# Patient Record
Sex: Female | Born: 1980 | Race: White | Hispanic: No | Marital: Married | Smoking: Never smoker
Health system: Southern US, Community
[De-identification: ages and names within clinical notes are randomized; demographics above are authoritative.]

## PROBLEM LIST (undated history)

## (undated) DIAGNOSIS — N839 Noninflammatory disorder of ovary, fallopian tube and broad ligament, unspecified: Secondary | ICD-10-CM

## (undated) DIAGNOSIS — F909 Attention-deficit hyperactivity disorder, unspecified type: Secondary | ICD-10-CM

## (undated) DIAGNOSIS — G4730 Sleep apnea, unspecified: Secondary | ICD-10-CM

## (undated) DIAGNOSIS — T8859XA Other complications of anesthesia, initial encounter: Secondary | ICD-10-CM

## (undated) DIAGNOSIS — M1990 Unspecified osteoarthritis, unspecified site: Secondary | ICD-10-CM

## (undated) DIAGNOSIS — J302 Other seasonal allergic rhinitis: Secondary | ICD-10-CM

## (undated) DIAGNOSIS — M722 Plantar fascial fibromatosis: Secondary | ICD-10-CM

## (undated) DIAGNOSIS — T4145XA Adverse effect of unspecified anesthetic, initial encounter: Secondary | ICD-10-CM

## (undated) DIAGNOSIS — T7840XA Allergy, unspecified, initial encounter: Secondary | ICD-10-CM

## (undated) DIAGNOSIS — E282 Polycystic ovarian syndrome: Secondary | ICD-10-CM

## (undated) HISTORY — DX: Attention-deficit hyperactivity disorder, unspecified type: F90.9

## (undated) HISTORY — DX: Unspecified osteoarthritis, unspecified site: M19.90

## (undated) HISTORY — DX: Plantar fascial fibromatosis: M72.2

## (undated) HISTORY — DX: Noninflammatory disorder of ovary, fallopian tube and broad ligament, unspecified: N83.9

## (undated) HISTORY — PX: MOUTH SURGERY: SHX715

## (undated) HISTORY — PX: TONSILLECTOMY: SUR1361

## (undated) HISTORY — DX: Allergy, unspecified, initial encounter: T78.40XA

## (undated) HISTORY — PX: FOOT SURGERY: SHX648

## (undated) SURGERY — LAPAROSCOPIC CHOLECYSTECTOMY
Anesthesia: General

---

## 2005-05-01 ENCOUNTER — Inpatient Hospital Stay (HOSPITAL_COMMUNITY): Admission: AD | Admit: 2005-05-01 | Discharge: 2005-05-03 | Payer: Self-pay | Admitting: Obstetrics and Gynecology

## 2006-06-15 ENCOUNTER — Emergency Department: Payer: Self-pay | Admitting: Internal Medicine

## 2011-04-03 ENCOUNTER — Ambulatory Visit: Payer: Self-pay | Admitting: General Practice

## 2012-11-14 ENCOUNTER — Emergency Department: Payer: Self-pay | Admitting: Emergency Medicine

## 2013-03-21 ENCOUNTER — Emergency Department: Payer: Self-pay | Admitting: Emergency Medicine

## 2013-03-21 LAB — COMPREHENSIVE METABOLIC PANEL
Albumin: 3.4 g/dL (ref 3.4–5.0)
Anion Gap: 5 — ABNORMAL LOW (ref 7–16)
Bilirubin,Total: 0.2 mg/dL (ref 0.2–1.0)
Calcium, Total: 9 mg/dL (ref 8.5–10.1)
EGFR (Non-African Amer.): >60
Glucose: 183 mg/dL — ABNORMAL HIGH (ref 65–99)
Osmolality: 280 (ref 275–301)
Sodium: 138 mmol/L (ref 136–145)

## 2013-03-21 LAB — LIPASE, BLOOD: Lipase: 84 U/L (ref 73–393)

## 2013-03-21 LAB — CBC
HCT: 42 % (ref 35.0–47.0)
HGB: 14 g/dL (ref 12.0–16.0)
MCHC: 33.2 g/dL (ref 32.0–36.0)

## 2013-06-16 ENCOUNTER — Ambulatory Visit: Payer: Self-pay | Admitting: Physician Assistant

## 2015-05-24 ENCOUNTER — Other Ambulatory Visit: Payer: Self-pay | Admitting: Certified Nurse Midwife

## 2015-05-24 DIAGNOSIS — N63 Unspecified lump in unspecified breast: Secondary | ICD-10-CM

## 2015-05-25 ENCOUNTER — Ambulatory Visit
Admission: RE | Admit: 2015-05-25 | Discharge: 2015-05-25 | Disposition: A | Discharge status: Home / Self Care | Payer: PRIVATE HEALTH INSURANCE | Source: Ambulatory Visit | Attending: Certified Nurse Midwife | Admitting: Certified Nurse Midwife

## 2015-05-25 ENCOUNTER — Other Ambulatory Visit: Payer: Self-pay | Admitting: Certified Nurse Midwife

## 2015-05-25 DIAGNOSIS — N63 Unspecified lump in unspecified breast: Secondary | ICD-10-CM

## 2015-06-28 ENCOUNTER — Encounter: Payer: Self-pay | Admitting: Family

## 2015-06-28 ENCOUNTER — Ambulatory Visit: Payer: Self-pay | Admitting: Family

## 2015-06-28 VITALS — BP 110/80 | HR 87 | Temp 98.3°F

## 2015-06-28 DIAGNOSIS — M25562 Pain in left knee: Secondary | ICD-10-CM

## 2015-06-28 DIAGNOSIS — Z8742 Personal history of other diseases of the female genital tract: Secondary | ICD-10-CM

## 2015-06-28 DIAGNOSIS — Z6841 Body Mass Index (BMI) 40.0 and over, adult: Secondary | ICD-10-CM

## 2015-06-28 MED ORDER — DICLOFENAC SODIUM 1 % TD GEL: 2.0000 g | Freq: Four times a day (QID) | TRANSDERMAL | Status: DC

## 2015-06-28 NOTE — Progress Notes (Signed)
S/ one mo h/o Left knee pain, no remote or recent injury. Morbidly obese.  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 She denies instability or locking up other joint involvement or fever Taking ibuprofen otc tid . O/ pleasant alert NAD   Morbidly obese Left knee without deformity, edema or redness ; minimal tenderness medially , no instability noted Gait normal  Squats without difficulty   A/ left knee pain P / advised limiting squatting at work . Continue motrin . RX for voltaren gel 2 g qid  topical heat or ice. Weight loss strongly encouraged.

## 2015-07-05 ENCOUNTER — Inpatient Hospital Stay: Payer: PRIVATE HEALTH INSURANCE | Attending: Obstetrics and Gynecology | Admitting: Obstetrics and Gynecology

## 2015-07-05 ENCOUNTER — Encounter: Payer: Self-pay | Admitting: Obstetrics and Gynecology

## 2015-07-05 VITALS — BP 141/92 | HR 66 | Temp 98.2°F | Ht 61.0 in | Wt 315.0 lb

## 2015-07-05 DIAGNOSIS — E282 Polycystic ovarian syndrome: Secondary | ICD-10-CM

## 2015-07-05 DIAGNOSIS — G4730 Sleep apnea, unspecified: Secondary | ICD-10-CM

## 2015-07-05 DIAGNOSIS — N83201 Unspecified ovarian cyst, right side: Secondary | ICD-10-CM | POA: Diagnosis not present

## 2015-07-05 DIAGNOSIS — N839 Noninflammatory disorder of ovary, fallopian tube and broad ligament, unspecified: Secondary | ICD-10-CM

## 2015-07-05 DIAGNOSIS — Z6841 Body Mass Index (BMI) 40.0 and over, adult: Secondary | ICD-10-CM | POA: Diagnosis not present

## 2015-07-05 DIAGNOSIS — N838 Other noninflammatory disorders of ovary, fallopian tube and broad ligament: Secondary | ICD-10-CM

## 2015-07-05 DIAGNOSIS — Z791 Long term (current) use of non-steroidal anti-inflammatories (NSAID): Secondary | ICD-10-CM

## 2015-07-05 HISTORY — DX: Noninflammatory disorder of ovary, fallopian tube and broad ligament, unspecified: N83.9

## 2015-07-05 NOTE — Progress Notes (Signed)
Gynecologic Oncology Consult Visit   Referring Provider:  Dalia Heading CNM, [facility]  Chief Concern: right ovarian cystic mass  Subjective:  Barbara Horn is a 34 y.o. G2P2 female who is seen in consultation from Dr. [name] for right ovarian mass.  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  BMI 62.  Refused medical management suggested by Dr Baxter Hire of [facility], such as low dose OCs and metformin, to decrease unopposed estrogen.    LMP 1/15, but had some spotting and endometrial bx 06/14/15 showed disordered proliferative endometrium.  PAP and HPV negative 9/16.  US done 9/16 to monitor for endometrial cancer showed normal uterus with 10 mm endometrial stripe and 7.8 x 7 x 5.5 cm multicystic right ovarian mass.   CA125 06/23/15 = 15.3  She has sleep apnea and fatigue.  Works in [employer].   Problem List: Patient Active Problem List   Diagnosis Date Noted  . Ovarian mass, right 07/05/2015  . History of PCOS 06/28/2015  . Morbid obesity ([place]) 06/28/2015    Past Medical History: Past Medical History  Diagnosis Date  . Breast mass     Left Lump 9 o'clock per physician    Past Surgical History: No past surgical history on file.  Family History: No family history on file.   No family history of breast or ovarian cancer.  All women in her family had hysterectomy by age 84 for various reasons.   Social History: Social History   Social History  . Marital Status: Married    Spouse Name: N/A  . Number of Children: N/A  . Years of Education: N/A   Occupational History  . Not on file.   Social History Main Topics  . Smoking status: Never Smoker   . Smokeless tobacco: Not on file  . Alcohol Use: No  . Drug Use: Not on file  . Sexual Activity: Not on file   Other Topics Concern  . Not on file   Social History Narrative    Allergies: Allergies  Allergen Reactions  . Penicillins     Current Medications: Current Outpatient Prescriptions   Medication Sig Dispense Refill  . diclofenac sodium (VOLTAREN) 1 % GEL Apply 2 g topically 4 (four) times daily. 1 Tube 2  . ibuprofen (ADVIL,MOTRIN) 800 MG tablet Take 800 mg by mouth every 8 (eight) hours as needed.     No current facility-administered medications for this visit.    Review of Systems General: negative for, fevers, chills, fatigue, changes in sleep, changes in weight or appetite Skin: negative for changes in color, texture, moles or lesions Eyes: negative for, changes in vision, pain, diplopia HEENT: negative for, change in hearing, pain, discharge, tinnitus, vertigo, voice changes, sore throat, neck masses Breasts: negative Pulmonary: negative for, dyspnea, orthopnea, productive cough Cardiac: negative for, palpitations, syncope, pain, discomfort, pressure Gastrointestinal: negative for, dysphagia, nausea, vomiting, jaundice, pain, constipation, diarrhea, hematemesis, hematochezia Genitourinary/Sexual: negative for, dysuria, discharge, hesitancy, nocturia, retention, stones, infections, STD's, incontinence Ob/Gyn: negative for, irregular bleeding, pain Musculoskeletal: negative for, pain, stiffness, swelling, range of motion limitation Hematology: No anemia, negative for, easy bruising, bleeding Neurologic/Psych: negative for, headaches, seizures, paralysis, weakness, tremor, change in gait, change in sensation, mood swings, depression, anxiety, change in memory  Objective:  Physical Examination:  BP 141/92 mmHg  Pulse 66  Temp(Src) 98.2 F (36.8 C) (Oral)  Wt 315 lb 0.6 oz (142.9 kg)   ECOG Performance Status: 0 - Asymptomatic  General appearance: alert, cooperative and appears stated  age HEENT:PERRLA, neck supple with midline trachea and thyroid without masses Lymph node survey: non-palpable, axillary, inguinal, supraclavicular Cardiovascular: regular rate and rhythm, no murmurs or gallops Respiratory: normal air entry, lungs clear to auscultation Breast  exam: not examined. Abdomen: soft, non-tender, without masses or organomegaly, no hernias and well healed incision Back: inspection of back is normal Extremities: extremities normal, atraumatic, no cyanosis or edema Skin exam - normal coloration and turgor, no rashes, no suspicious skin lesions noted. Neurological exam reveals alert, oriented, normal speech, no focal findings or movement disorder noted.  Pelvic: exam chaperoned by nurse;  Vulva: normal appearing vulva with no masses, tenderness or lesions; Vagina: normal vagina; Adnexa: nontender and no masses; Uterus: uterus is normal size, shape, consistency and nontender; Cervix: no lesions; Rectal: normal rectal, no masses    Assessment:  Barbara Horn is a 34 y.o. female with Polycystic Ovary Syndrome who was found to have 7 cm right ovarian multicystic mass on ultrasound. Referred for consideration for surgery due to her morbid obesity.  She is asymptomatic.  Risk of ovarian cancer is low due to her young age, [place] characteristics of the mass and normal CA125.  Multicystic ovary may be related to large functional cysts related to PCOS, but cysts not usually that large in this syndrome.    Plan:   Problem List Items Addressed This Visit      Other   Ovarian mass, right - Primary      We discussed options for management including most notably seeing an endocrinologist at [facility] clinic to start medical management of PCOS with metformin and possibly OCs.  She has been offered this before and refused,but seems ready to comply now.  In view of low risk for ovarian cancer and lack of symptoms, would not recommend surgery immediately to remove the mass.  Would repeat US in 6 weeks at [facility] for surveillance.  If ovarian cysts decrease with endocrine therapy, or remain the same, surgical intervention is not mandatory.  Surgery would be risky because of her morbid obesity and it is unlikely that the ovarian lesion is life a threatening cancer.  We  can see her back to consider surgery if the mass grows further or is causing symptoms.        Mellody Drown, MD  CC:  Dalia Heading, [address] [place], [place] [address] [phone]

## 2015-07-05 NOTE — Progress Notes (Signed)
Assisted MD with pelvic exam

## 2015-08-11 ENCOUNTER — Encounter: Payer: Self-pay | Admitting: Physician Assistant

## 2015-08-11 ENCOUNTER — Ambulatory Visit: Payer: Self-pay | Admitting: Physician Assistant

## 2015-08-11 VITALS — BP 120/70 | HR 81 | Temp 98.5°F

## 2015-08-11 DIAGNOSIS — H10023 Other mucopurulent conjunctivitis, bilateral: Secondary | ICD-10-CM

## 2015-08-11 MED ORDER — CIPROFLOXACIN HCL 0.3 % OP SOLN: 1.0000 [drp] | OPHTHALMIC | Status: DC

## 2015-08-11 NOTE — Addendum Note (Signed)
Addended by: Colleen Can A on: 08/11/2015 10:58 AM   Modules accepted: Orders

## 2015-08-11 NOTE — Progress Notes (Signed)
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 Handwashing .

## 2015-08-11 NOTE — Addendum Note (Signed)
Addended by: Colleen Can A on: 08/11/2015 10:14 AM   Modules accepted: Orders

## 2015-09-20 DIAGNOSIS — N83202 Unspecified ovarian cyst, left side: Secondary | ICD-10-CM

## 2015-09-20 DIAGNOSIS — N83201 Unspecified ovarian cyst, right side: Secondary | ICD-10-CM | POA: Insufficient documentation

## 2015-09-28 ENCOUNTER — Encounter: Payer: Self-pay | Admitting: Physician Assistant

## 2015-09-28 ENCOUNTER — Ambulatory Visit: Payer: Self-pay | Admitting: Physician Assistant

## 2015-09-28 VITALS — BP 119/70 | HR 73 | Temp 98.0°F

## 2015-09-28 DIAGNOSIS — J069 Acute upper respiratory infection, unspecified: Secondary | ICD-10-CM

## 2015-09-28 MED ORDER — AZITHROMYCIN 250 MG PO TABS: ORAL_TABLET | ORAL | Status: DC

## 2015-09-28 MED ORDER — METHYLPREDNISOLONE 4 MG PO TBPK: ORAL_TABLET | ORAL | Status: DC

## 2015-09-28 NOTE — Progress Notes (Signed)
S: C/o runny nose and congestion for 5 days, no fever, chills, cp/sob, v/d; mucus was green this am but clear throughout the day, cough is sporadic, is wheezing some but used her husbands inhaler  O: PE: vitals wnl, nad, perrl eomi, normocephalic, tms dull, nasal mucosa red and swollen, throat injected, neck supple no lymph, lungs c t a, cv rrr, neuro intact  A:  Acute uri   P: zpack, medrol dose pack, drink fluids, continue regular meds , use otc meds of choice, return if not improving in 5 days, return earlier if worsening

## 2015-10-23 ENCOUNTER — Encounter: Payer: Self-pay | Admitting: Physician Assistant

## 2015-10-23 ENCOUNTER — Ambulatory Visit: Payer: Self-pay | Admitting: Physician Assistant

## 2015-10-23 VITALS — BP 130/80 | HR 91 | Temp 97.8°F

## 2015-10-23 DIAGNOSIS — J018 Other acute sinusitis: Secondary | ICD-10-CM

## 2015-10-23 MED ORDER — CEFDINIR 300 MG PO CAPS: 300.0000 mg | ORAL_CAPSULE | Freq: Two times a day (BID) | ORAL | Status: DC

## 2015-10-23 MED ORDER — FLUCONAZOLE 150 MG PO TABS: 150.0000 mg | ORAL_TABLET | Freq: Once | ORAL | Status: DC

## 2015-10-23 MED ORDER — PREDNISONE 10 MG PO TABS: 30.0000 mg | ORAL_TABLET | Freq: Every day | ORAL | Status: DC

## 2015-10-23 NOTE — Progress Notes (Signed)
S: C/o runny nose and congestion for 3 days, no fever, some chills, denies  cp/sob, v/d; mucus is green and thick in the morning, c/o of facial and ear pain. Had similar sx few weeks ago, treated with zpack  Using otc meds:   O: PE: vitals wnl, nad,  perrl eomi, normocephalic, tms dull, nasal mucosa red and swollen, throat injected, neck supple no lymph, lungs c t a, cv rrr, neuro intact  A:  Acute sinusitis   Pomnicef 300mg  bid, prednisone 30mg  qd x 3d, diflucan: drink fluids, continue regular meds , use otc meds of choice, return if not improving in 5 days, return earlier if worsening

## 2015-10-30 ENCOUNTER — Ambulatory Visit: Payer: Self-pay | Admitting: Physician Assistant

## 2015-10-30 ENCOUNTER — Encounter: Payer: Self-pay | Admitting: Physician Assistant

## 2015-10-30 VITALS — BP 110/70 | HR 69 | Temp 97.8°F

## 2015-10-30 DIAGNOSIS — R1030 Lower abdominal pain, unspecified: Secondary | ICD-10-CM

## 2015-10-30 LAB — POCT URINALYSIS DIPSTICK
Bilirubin, UA: NEGATIVE
GLUCOSE UA: NEGATIVE
Leukocytes, UA: NEGATIVE
NITRITE UA: NEGATIVE
PROTEIN UA: NEGATIVE
Spec Grav, UA: 1.015
UROBILINOGEN UA: 0.2
pH, UA: 6

## 2015-10-30 NOTE — Progress Notes (Signed)
S: pt has hx of abdominal/pelvic masses, surgeon will not take them out until she loses weight, started having some blood in urine, no flank pain, no fever/chills, just wanted to check and see if its a uti  O: vitals wnl, nad, ua 1+ blood, no cva tenderness  A: abdominal pain  P: pt to f/u with gyn at [facility] , to call today to discuss pain and bleeding

## 2015-10-30 NOTE — Addendum Note (Signed)
Addended by: Versie Starks on: 10/30/2015 08:43 AM   Modules accepted: Orders

## 2015-11-17 ENCOUNTER — Ambulatory Visit: Payer: Self-pay | Admitting: Physician Assistant

## 2015-11-17 ENCOUNTER — Encounter: Payer: Self-pay | Admitting: Physician Assistant

## 2015-11-17 VITALS — BP 130/70 | HR 76 | Temp 98.3°F

## 2015-11-17 DIAGNOSIS — L0291 Cutaneous abscess, unspecified: Secondary | ICD-10-CM

## 2015-11-17 MED ORDER — SULFAMETHOXAZOLE-TRIMETHOPRIM 800-160 MG PO TABS
1.0000 | ORAL_TABLET | Freq: Two times a day (BID) | ORAL | Status: DC
Start: 2015-11-17 — End: 2015-11-24

## 2015-11-17 NOTE — Progress Notes (Signed)
S: red swollen area on left side of jaw line, had a pimple there, then scratched the area, now its hard and swollen, no fever/chills/dif breathing  O: vitals wnl, nad, skin with red swollen area on near chin, no drainage, no fluctuance, area of hardness extends into neck, air way is intact, n/v intact  A: abscess  P: septra ds 1 po bid, warm compress, if area is getting larger and is pushing toward middle of throat pt should go to ER asap; return if not better by [facility]

## 2015-11-24 ENCOUNTER — Encounter: Payer: Self-pay | Admitting: Physician Assistant

## 2015-11-24 ENCOUNTER — Ambulatory Visit: Payer: Self-pay | Admitting: Physician Assistant

## 2015-11-24 VITALS — BP 130/80 | HR 89 | Temp 98.3°F

## 2015-11-24 DIAGNOSIS — B349 Viral infection, unspecified: Secondary | ICD-10-CM

## 2015-11-24 LAB — POCT INFLUENZA A/B
INFLUENZA A, POC: NEGATIVE
INFLUENZA B, POC: NEGATIVE

## 2015-11-24 MED ORDER — FEXOFENADINE-PSEUDOEPHED ER 60-120 MG PO TB12: 1.0000 | ORAL_TABLET | Freq: Two times a day (BID) | ORAL | Status: DC

## 2015-11-24 NOTE — Addendum Note (Signed)
Addended by: Rudene Anda T on: 11/24/2015 01:49 PM   Modules accepted: Orders

## 2015-11-24 NOTE — Progress Notes (Signed)
   Subjective:    Patient ID: Barbara Horn, female    DOB: 12-Sep-1981, 35 y.o.   MRN: [id number]  HPI Patient c/o malaise, sinus congestion and ear pressure. Recent treated for adenopathy and took last dose of Bactrim DS this morning. Denies N/V/D. No Flu shot for this season.   Review of Systems Obesity, Mandibular and Auricular Adenopathy.     Objective:   Physical Exam Appears malaise, Resolving Adenopathy from last visit. Bilateral edematous nasal turbinates with clear Rhinorrhea. Post nasal drainage. Neck supple, Lungs CTA, and Heart RRR. Rapid Flu Test is negative.      Assessment & Plan:  Viral illness with nasal congestion.  Allergra-D. Work note.  Follow up 3 days if no improvement.

## 2015-12-01 ENCOUNTER — Ambulatory Visit: Payer: Self-pay | Admitting: Physician Assistant

## 2015-12-01 ENCOUNTER — Encounter: Payer: Self-pay | Admitting: Physician Assistant

## 2015-12-01 VITALS — BP 138/76 | HR 78 | Temp 98.4°F

## 2015-12-01 DIAGNOSIS — H6982 Other specified disorders of Eustachian tube, left ear: Secondary | ICD-10-CM

## 2015-12-01 MED ORDER — FLUTICASONE PROPIONATE 50 MCG/ACT NA SUSP: 2.0000 | Freq: Every day | NASAL | Status: DC

## 2015-12-01 NOTE — Progress Notes (Signed)
S: c/o of left ear feeling full and having pressure, some sinus drainage, no fever/chills/cough, also still has lump where abscess was, it finally opened and drained , finished antibiotics, still taking allegra d for her ear  O: vitals wnl, nad, tms dull, nasal mucosa wnl, throat wnl, neck supple no lymph, lungs c t a, cv rrr, skin with healed abscess, area is still a little hard which is residual  A: healed abscess, eustachean tube dysfunction  P: flonase and sudafed, stop allegra d; (was expensive for pt); warm compress to area of abscess

## 2015-12-18 ENCOUNTER — Other Ambulatory Visit: Payer: Self-pay | Admitting: Emergency Medicine

## 2015-12-18 DIAGNOSIS — B349 Viral infection, unspecified: Secondary | ICD-10-CM

## 2015-12-18 MED ORDER — FEXOFENADINE-PSEUDOEPHED ER 60-120 MG PO TB12: 1.0000 | ORAL_TABLET | Freq: Two times a day (BID) | ORAL | Status: DC

## 2015-12-18 NOTE — Telephone Encounter (Signed)
Received a faxed medication request from [facility] in [place].  Please advise.  Thank you. 

## 2015-12-18 NOTE — Telephone Encounter (Signed)
Med refill approved 

## 2015-12-21 ENCOUNTER — Ambulatory Visit: Payer: Managed Care, Other (non HMO) | Attending: Gynecologic Oncology | Admitting: Gynecologic Oncology

## 2015-12-21 ENCOUNTER — Encounter: Payer: Self-pay | Admitting: Gynecologic Oncology

## 2015-12-21 VITALS — BP 147/80 | HR 68 | Temp 98.3°F | Resp 18 | Ht 61.0 in | Wt 289.2 lb

## 2015-12-21 DIAGNOSIS — N939 Abnormal uterine and vaginal bleeding, unspecified: Secondary | ICD-10-CM | POA: Insufficient documentation

## 2015-12-21 DIAGNOSIS — N83202 Unspecified ovarian cyst, left side: Secondary | ICD-10-CM | POA: Insufficient documentation

## 2015-12-21 DIAGNOSIS — N83201 Unspecified ovarian cyst, right side: Secondary | ICD-10-CM | POA: Insufficient documentation

## 2015-12-21 DIAGNOSIS — R634 Abnormal weight loss: Secondary | ICD-10-CM | POA: Diagnosis not present

## 2015-12-21 DIAGNOSIS — D3501 Benign neoplasm of right adrenal gland: Secondary | ICD-10-CM | POA: Diagnosis not present

## 2015-12-21 DIAGNOSIS — Z8742 Personal history of other diseases of the female genital tract: Secondary | ICD-10-CM | POA: Diagnosis not present

## 2015-12-21 DIAGNOSIS — L03211 Cellulitis of face: Secondary | ICD-10-CM | POA: Diagnosis not present

## 2015-12-21 DIAGNOSIS — K130 Diseases of lips: Secondary | ICD-10-CM | POA: Insufficient documentation

## 2015-12-21 DIAGNOSIS — E282 Polycystic ovarian syndrome: Secondary | ICD-10-CM

## 2015-12-21 DIAGNOSIS — Z6841 Body Mass Index (BMI) 40.0 and over, adult: Secondary | ICD-10-CM | POA: Diagnosis not present

## 2015-12-21 DIAGNOSIS — E119 Type 2 diabetes mellitus without complications: Secondary | ICD-10-CM | POA: Insufficient documentation

## 2015-12-21 DIAGNOSIS — R5382 Chronic fatigue, unspecified: Secondary | ICD-10-CM | POA: Insufficient documentation

## 2015-12-21 DIAGNOSIS — N7011 Chronic salpingitis: Secondary | ICD-10-CM | POA: Diagnosis not present

## 2015-12-21 MED ORDER — CLINDAMYCIN HCL 300 MG PO CAPS: 300.0000 mg | ORAL_CAPSULE | Freq: Three times a day (TID) | ORAL | Status: DC

## 2015-12-21 NOTE — Progress Notes (Signed)
GYNECOLOGIC ONCOLOGY OFFICE VISIT  Referring Provider: Dr. Teresa Coombs  ASSESSMENT AND PLAN: Barbara Horn is a 35 y.o. woman with bilateral hydrosalpinges, MRI obtained in March 2017's notable for bilateral fluid-filled fallopian tubes. The adnexa appeared normal. Advised to follow-up with [name] Follow-up with GYN oncology when necessary pelvic discomfort  [name] Counseled on the importance of regular uterine bleeding to decrease the risk of endometrial cancer. Continue OCPs as prescribed  Abnormal uterine bleeding Barbara Horn has been conscientious with a menstrual calendar and it identifies that the menses are becoming shorter.  morbid obesity.  Obesity  Barbara Horn has sought assistance from her endocrinologist and her primary care doctors regarding medically supervised weight loss measures but has not received much support. She will continue in her endeavors to lose weight and reports a 20 pound weight loss for the past few months with her current diet and exercise regimen.  Left sided lip cellulitis Erythema and swelling were recognized this morning. She reports mild tenderness denies fever and chills Clindamycin prescribed Advised to follow up in the emergency room if there is increase in the swelling or tenderness or intensity of the erythema.  HISTORY OF PRESENT ILLNESS: Barbara Horn is a 35 y.o. woman who is seen in consultation at the request of Dr. Kenton Kingfisher for evaluation of bilateral ovarian cyst c/b morbid obesity.  She has a past medical history significant for morbid obesity with a BMI of 62, PCOS, amenorrhea, and diabetes with an A1c of 6.1. She presented to her GYN with amenorrhea at least 91mo, endometrial biopsy was performed that showed proliferative endometrium. Pap smear was normal. On 06/14/15 a transvaginal ultrasound showed a normal uterus, 8 x 5 x 4cm, ES 5.108mm.  A 7.8 x 7.1 x 5.6 septated right ovarian cyst was noted. CA-125 was  normal. She was referred to [facility]) who recommended follow-up ultrasound. She was started on OCPs for ovulatory suppression and metformin. On 09/12/15, transvaginal ultrasound showed 7.8 x 8.9 x 8.5 cm right ovarian cyst with avasular septations (slightly larger), and a new left ovarian cyst 4 x 4.1 x 3.2 cm with avascular septations. She is asymptomatic and declined to go back to [facility] for further follow-up.  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 Normal appearance of the adjacent ovaries. --Prominent junctional zone is seen in the uterus. This may be an indirect sign of adenomyosis.  - Right adrenal adenoma.   Past Medical History  Diagnosis Date  . Obesity   . Bilateral ovarian cysts   . Chronic fatigue   . PCOS (polycystic ovarian syndrome)      PAST SURGICAL HISTORY: Past Surgical History  Procedure Laterality Date  . Myomectomy      x2  . Tonsilectomy, adenoidectomy, bilateral myringotomy and tubes      PHYSICAL EXAM: BP 147/80 mmHg  Pulse 68  Temp(Src) 98.3 F (36.8 C) (Oral)  Resp 18  Ht 5\' 1"  (1.549 m)  Wt 289 lb 3.2 oz (131.18 kg)  BMI 54.67 kg/m2  SpO2 100%  HEENT.  Mild erythema surounding the left lateral aspect of the lip with edema extension to the lower cheek.  No lymphangitic streaking, no fluctuance, tender to light touch General: Alert, oriented,  no acute distress. Chest: Clear to auscultation bilaterally. Cardiovascular: Regular rate and rhythm, no murmurs, rubs, or gallops. Abdomen: Morbidly Obese. Soft, nondistended, nontender to palpation.  No masses or hepatosplenomegaly appreciated.   Extremities: Grossly normal range of motion.  Warm, well perfused.  No edema  bilaterally. Skin: No rashes or lesions. Lymphatics: No cervical, supraclavicular, or inguinal adenopathy. GU: deferred

## 2015-12-21 NOTE — Patient Instructions (Addendum)
PCOS  Continue  OCP and menstrual calendar   Lip cellulitis Antibiotics  as prescribed Present to ER if swelling is worse in 24 hours  Follow up in one year or sooner if needed.  Clindamycin capsules What is this medicine? CLINDAMYCIN (Snyder sin) is a lincosamide antibiotic. It is used to treat certain kinds of bacterial infections. It will not work for colds, flu, or other viral infections. This medicine may be used for other purposes; ask your health care provider or pharmacist if you have questions. What should I tell my health care provider before I take this medicine? They need to know if you have any of these conditions: -kidney disease -liver disease -stomach problems like colitis -an unusual or allergic reaction to clindamycin, lincomycin, or other medicines, foods, dyes like tartrazine or preservatives -pregnant or trying to get pregnant -breast-feeding How should I use this medicine? Take this medicine by mouth with a full glass of water. Follow the directions on the prescription label. You can take this medicine with food or on an empty stomach. If the medicine upsets your stomach, take it with food. Take your medicine at regular intervals. Do not take your medicine more often than directed. Take all of your medicine as directed even if you think your are better. Do not skip doses or stop your medicine early. Talk to your pediatrician regarding the use of this medicine in children. Special care may be needed. Overdosage: If you think you have taken too much of this medicine contact a poison control center or emergency room at once. NOTE: This medicine is only for you. Do not share this medicine with others. What if I miss a dose? If you miss a dose, take it as soon as you can. If it is almost time for your next dose, take only that dose. Do not take double or extra doses. What may interact with this medicine? -birth control  pills -chloramphenicol -erythromycin -kaolin products This list may not describe all possible interactions. Give your health care provider a list of all the medicines, herbs, non-prescription drugs, or dietary supplements you use. Also tell them if you smoke, drink alcohol, or use illegal drugs. Some items may interact with your medicine. What should I watch for while using this medicine? Tell your doctor or healthcare professional if your symptoms do not start to get better or if they get worse. Do not treat diarrhea with over the counter products. Contact your doctor if you have diarrhea that lasts more than 2 days or if it is severe and watery. What side effects may I notice from receiving this medicine? Side effects that you should report to your doctor or health care professional as soon as possible: -allergic reactions like skin rash, itching or hives, swelling of the face, lips, or tongue -dark urine -pain on swallowing -redness, blistering, peeling or loosening of the skin, including inside the mouth -unusual bleeding or bruising -unusually weak or tired -yellowing of eyes or skin Side effects that usually do not require medical attention (report to your doctor or health care professional if they continue or are bothersome): -diarrhea -itching in the rectal or genital area -joint pain -nausea, vomiting -stomach pain This list may not describe all possible side effects. Call your doctor for medical advice about side effects. You may report side effects to FDA at 1-800-FDA-1088. Where should I keep my medicine? Keep out of the reach of children. Store at room temperature between 20 and 25 degrees C (  68 and 77 degrees F). Throw away any unused medicine after the expiration date. NOTE: This sheet is a summary. It may not cover all possible information. If you have questions about this medicine, talk to your doctor, pharmacist, or health care provider.    2016, [employer]  Standard. (2013-04-15 16:12:32)

## 2015-12-22 ENCOUNTER — Emergency Department
Admission: EM | Admit: 2015-12-22 | Discharge: 2015-12-23 | Disposition: A | Discharge status: Home / Self Care | Payer: Managed Care, Other (non HMO) | Attending: Emergency Medicine | Admitting: Emergency Medicine

## 2015-12-22 ENCOUNTER — Encounter: Payer: Self-pay | Admitting: Emergency Medicine

## 2015-12-22 DIAGNOSIS — K130 Diseases of lips: Secondary | ICD-10-CM | POA: Diagnosis not present

## 2015-12-22 DIAGNOSIS — L0201 Cutaneous abscess of face: Secondary | ICD-10-CM

## 2015-12-22 LAB — BASIC METABOLIC PANEL
Anion gap: 9 (ref 5–15)
BUN: 13 mg/dL (ref 6–20)
CALCIUM: 9.1 mg/dL (ref 8.9–10.3)
CHLORIDE: 106 mmol/L (ref 101–111)
CO2: 21 mmol/L — ABNORMAL LOW (ref 22–32)
CREATININE: 0.68 mg/dL (ref 0.44–1.00)
Glucose, Bld: 67 mg/dL (ref 65–99)
Potassium: 3.8 mmol/L (ref 3.5–5.1)
SODIUM: 136 mmol/L (ref 135–145)

## 2015-12-22 LAB — CBC WITH DIFFERENTIAL/PLATELET
BASOS PCT: 0 %
Basophils Absolute: 0.1 10*3/uL (ref 0–0.1)
EOS ABS: 0.4 10*3/uL (ref 0–0.7)
EOS PCT: 4 %
HCT: 40.9 % (ref 35.0–47.0)
Hemoglobin: 13.1 g/dL (ref 12.0–16.0)
LYMPHS ABS: 3 10*3/uL (ref 1.0–3.6)
Lymphocytes Relative: 24 %
MCH: 27.5 pg (ref 26.0–34.0)
MCHC: 32.2 g/dL (ref 32.0–36.0)
MCV: 85.4 fL (ref 80.0–100.0)
MONO ABS: 1 10*3/uL — AB (ref 0.2–0.9)
MONOS PCT: 8 %
Neutro Abs: 7.8 10*3/uL — ABNORMAL HIGH (ref 1.4–6.5)
Neutrophils Relative %: 64 %
PLATELETS: 241 10*3/uL (ref 150–440)
RBC: 4.79 MIL/uL (ref 3.80–5.20)
RDW: 14.8 % — AB (ref 11.5–14.5)
WBC: 12.2 10*3/uL — ABNORMAL HIGH (ref 3.6–11.0)

## 2015-12-22 MED ORDER — LIDOCAINE 5 % EX OINT
TOPICAL_OINTMENT | Freq: Once | CUTANEOUS | Status: AC
  Administered 2015-12-22: 1 via TOPICAL
  Filled 2015-12-22: qty 35.44

## 2015-12-22 MED ORDER — LIDOCAINE 4 % EX CREA
TOPICAL_CREAM | Freq: Once | CUTANEOUS | Status: DC
  Filled 2015-12-22: qty 5

## 2015-12-22 MED ORDER — LIDOCAINE-EPINEPHRINE-TETRACAINE (LET) SOLUTION: 3.0000 mL | Freq: Once | NASAL | Status: DC

## 2015-12-22 MED ORDER — MUPIROCIN CALCIUM 2 % EX CREA
TOPICAL_CREAM | Freq: Once | CUTANEOUS | Status: AC
  Administered 2015-12-23: 1 via TOPICAL
  Filled 2015-12-22: qty 15

## 2015-12-22 MED ORDER — OXYCODONE-ACETAMINOPHEN 5-325 MG PO TABS
1.0000 | ORAL_TABLET | ORAL | Status: DC | PRN
  Administered 2015-12-22: 1 via ORAL
  Filled 2015-12-22: qty 1

## 2015-12-22 NOTE — ED Notes (Signed)
Pt started out with red bump to left upper lip area.  Started clindamycin and reports has had 5 doses but swelling has gotten worse despite starting abx.  Noted left lip and facial swelling.  Denies fever but has been taking motrin around clock.

## 2015-12-22 NOTE — ED Provider Notes (Signed)
[facility] Emergency Department Provider Note  ____________________________________________  Time seen: Approximately 9:14 PM  I have reviewed the triage vital signs and the nursing notes.   HISTORY  Chief Complaint Facial Swelling    HPI Barbara Horn is a 35 y.o. 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  It started about a week ago as a small red bump that did not really even look like a pimple.  Over several days ago worse and was very swollen and tender with a lot of redness essentially over the entire left side of her upper lip.  She saw her hematologist oncologist (she does not have cancer and was just released from their care, this was a last visit) and was started on clindamycin.  She has had 5 doses and although the swelling does not involve her entire lip anymore (it had spread to the other side), the pain is getting worse in the area of redness on the leftside.  She has opened the wound once and a small amount of pus drained out, but it is now covered again with a scab with a surrounding white "head".   She denies fever/chills, N/V/D, CP, SOB, abd pain.  Pain is severe, worsening by palpation, and worse (along with the swelling) after using her CPAP.  Nothing makes it better.     Past Medical History  Diagnosis Date  . Breast mass     Left Lump 9 o'clock per physician    Patient Active Problem List   Diagnosis Date Noted  . Ovarian mass, right 07/05/2015  . History of PCOS 06/28/2015  . Morbid obesity ([place]) 06/28/2015    History reviewed. No pertinent past surgical history.  Current Outpatient Rx  Name  Route  Sig  Dispense  Refill  . clindamycin (CLEOCIN) 300 MG capsule   Oral   Take 1 capsule (300 mg total) by mouth 3 (three) times daily.   21 capsule   0   . docusate sodium (COLACE) 100 MG capsule       Take 1 tablet once or twice daily as needed for constipation while taking narcotic pain medicine   30 capsule   0   . doxycycline (VIBRAMYCIN) 100 MG capsule      Take 1 capsule (100 mg) by mouth twice daily for 10 days.   20 capsule   0   . fluticasone (FLONASE) 50 MCG/ACT nasal spray   Each Nare   Place 2 sprays into both nostrils daily.   16 g   6   . HYDROcodone-acetaminophen (NORCO/VICODIN) 5-325 MG tablet   Oral   Take 1-2 tablets by mouth every 4 (four) hours as needed for moderate pain.   15 tablet   0   . ibuprofen (ADVIL,MOTRIN) 800 MG tablet   Oral   Take 800 mg by mouth every 8 (eight) hours as needed.         Marland Kitchen levonorgestrel-ethinyl estradiol (ALTAVERA) 0.15-30 MG-MCG tablet      TAKE ONE TABLET BY MOUTH ONCE DAILY FOR 6 WEEKS, THEN STOP FOR 1 WEEK, THEN REPEAT CYCLE         . metFORMIN (GLUCOPHAGE) 1000 MG tablet   Oral   Take 1,000 mg by mouth daily with breakfast.            Allergies Bee venom and Penicillins  History reviewed. No pertinent family history.  Social History Social History  Substance Use Topics  . Smoking status: Never Smoker   . Smokeless tobacco: None  . Alcohol Use: No    Review of Systems Constitutional: No fever/chills Eyes: No visual changes. ENT: No sore throat. Cardiovascular: Denies chest pain. Respiratory: Denies shortness of breath. Gastrointestinal: No abdominal pain.  No nausea, no vomiting.  No diarrhea.  No constipation. Genitourinary: Negative for dysuria. Musculoskeletal: Negative for back pain. Skin: Abscess and swelling to left upper lip Neurological: Negative for headaches, focal weakness or numbness.  10-point ROS otherwise negative.  ____________________________________________   PHYSICAL EXAM:  VITAL SIGNS: ED Triage Vitals  Enc Vitals Group     BP 12/22/15 1817 154/100 mmHg     Pulse Rate 12/22/15 1817 79     Resp 12/22/15 1817 18     Temp 12/22/15 1817 99.3 F (37.4 C)     Temp  Source 12/22/15 1817 Oral     SpO2 12/22/15 1817 100 %     Weight 12/22/15 1817 285 lb (129.275 kg)     Height 12/22/15 1817 5' (1.524 m)     Head Cir --      Peak Flow --      Pain Score 12/22/15 1815 9     Pain Loc --      Pain Edu? --      Excl. in [place]? --     Constitutional: Alert and oriented. Well appearing but tearful from pain Eyes: Conjunctivae are normal. PERRL. EOMI. Head: Atraumatic. Nose: No congestion/rhinnorhea. Mouth/Throat: Approx 3-cm diameter area of erythema and induration with central white "head" of underlying purulence, consistent with abscess.  Palpable from inside the lip, but does not pass through.  No radiating cellulitis.  Tender and warm. Neck: No stridor.  No meningeal signs.   Cardiovascular: Normal rate, regular rhythm. Good peripheral circulation. Grossly normal heart sounds.   Respiratory: Normal respiratory effort.  No retractions. Lungs CTAB. Gastrointestinal: Soft and nontender. No distention.  Musculoskeletal: No lower extremity tenderness nor edema. No gross deformities of extremities. Neurologic:  Normal speech and language. No gross focal neurologic deficits are appreciated.  Psychiatric: Mood and affect are normal. Speech and behavior are normal.  ____________________________________________   LABS (all labs ordered are listed, but only abnormal results are displayed)  Labs Reviewed  CBC WITH DIFFERENTIAL/PLATELET - Abnormal; Notable for the following:    WBC 12.2 (*)    RDW 14.8 (*)    Neutro Abs 7.8 (*)    Monocytes Absolute 1.0 (*)    All other components within normal limits  BASIC METABOLIC PANEL - Abnormal; Notable for the following:    CO2 21 (*)    All other components within normal limits  WOUND CULTURE  ANAEROBIC CULTURE   ____________________________________________  EKG  None ____________________________________________  RADIOLOGY   No results  found.  ____________________________________________   PROCEDURES  Procedure(s) performed: I&D, see procedure note(s).  INCISION AND DRAINAGE Performed by: Hinda Kehr Consent: Verbal consent obtained. Risks and benefits: risks, benefits and alternatives were discussed Type: abscess  Body area: left upper lip  Abscess was "de-roofed" with scalpel #11 and gently probed with 18-gauge needle  Complexity: complex Blunt dissection to break up loculations  Drainage: purulent  Drainage amount: 1-2 mL  Aerobic and anaerobic culture obtained.  After drainage and pressing gently from inside lip to express purulent material, I cleansed the wound with iodine (as recommended by Dr. Kathyrn Sheriff) and applied Bactroban directly  to the wound.  Patient tolerance: Patient tolerated the procedure well with no immediate complications.     Critical Care performed: No ____________________________________________   INITIAL IMPRESSION / ASSESSMENT AND PLAN / ED COURSE  Pertinent labs & imaging results that were available during my care of the patient were reviewed by me and considered in my medical decision making (see chart for details).  Spoke by phone with Dr. Kathyrn Sheriff to discuss appropriate management.  He recommended taking the roof off the abscess and allowing purulent material to drain, but not to incise as I would with a cutaneous abscess on other parts of the body.  He suggested draining what would come out, cleansing with iodine, and applying Bactroban inside the wound.  Advised patient to remove scab at least daily if not twice a day and reapply Bactroban (gave her the tube).  She will follow up in ENT clinic on Monday.  Gave strict return precautions.  Also started patient on doxycycline in addition to current clindamycin, which Dr. Kathyrn Sheriff also recommended.  ____________________________________________  FINAL CLINICAL IMPRESSION(S) / ED DIAGNOSES  Final diagnoses:  Facial abscess       NEW MEDICATIONS STARTED DURING THIS VISIT:  Discharge Medication List as of 12/23/2015 12:10 AM    START taking these medications   Details  docusate sodium (COLACE) 100 MG capsule Take 1 tablet once or twice daily as needed for constipation while taking narcotic pain medicine, Print    doxycycline (VIBRAMYCIN) 100 MG capsule Take 1 capsule (100 mg) by mouth twice daily for 10 days., Print    HYDROcodone-acetaminophen (NORCO/VICODIN) 5-325 MG tablet Take 1-2 tablets by mouth every 4 (four) hours as needed for moderate pain., Starting 12/23/2015, Until Discontinued, Print          Note:  This document was prepared using Dragon voice recognition software and may include unintentional dictation errors.   Hinda Kehr, MD 12/23/15 [phone]

## 2015-12-23 MED ORDER — HYDROCODONE-ACETAMINOPHEN 5-325 MG PO TABS: 1.0000 | ORAL_TABLET | ORAL | Status: DC | PRN

## 2015-12-23 MED ORDER — DOCUSATE SODIUM 100 MG PO CAPS: ORAL_CAPSULE | ORAL | Status: DC

## 2015-12-23 MED ORDER — OXYCODONE-ACETAMINOPHEN 5-325 MG PO TABS
2.0000 | ORAL_TABLET | Freq: Once | ORAL | Status: AC
Start: 2015-12-23 — End: 2015-12-23
  Administered 2015-12-23: 2 via ORAL
  Filled 2015-12-23: qty 2

## 2015-12-23 MED ORDER — DOXYCYCLINE HYCLATE 100 MG PO TABS
100.0000 mg | ORAL_TABLET | Freq: Once | ORAL | Status: AC
  Administered 2015-12-23: 100 mg via ORAL
  Filled 2015-12-23: qty 1

## 2015-12-23 MED ORDER — DOXYCYCLINE HYCLATE 100 MG PO CAPS: ORAL_CAPSULE | ORAL | Status: DC

## 2015-12-23 NOTE — Discharge Instructions (Signed)
As we discussed, you need to remove the scab from your abscess twice daily and re-apply the Bactroban ointment.  When you shower you can thoroughly wash and carefully and gently dry the wound before reapplying the ointment.  Take both the previously prescribed antibiotics (clindamycin) and the new antibiotics I prescribed (doxycycline).  Follow up with Dr. Kathyrn Sheriff on Monday.    Take Norco as prescribed. Do not drink alcohol, drive or participate in any other potentially dangerous activities while taking this medication as it may make you sleepy. Do not take this medication with any other sedating medications, either prescription or over-the-counter.  If your wound is getting worse, or if you develop new symptoms that concern you, please return immediately to the Emergency Department.   Abscess An abscess is an infected area that contains a collection of pus and debris.It can occur in almost any part of the body. An abscess is also known as a furuncle or boil. CAUSES  An abscess occurs when tissue gets infected. This can occur from blockage of oil or sweat glands, infection of hair follicles, or a minor injury to the skin. As the body tries to fight the infection, pus collects in the area and creates pressure under the skin. This pressure causes pain. People with weakened immune systems have difficulty fighting infections and get certain abscesses more often.  SYMPTOMS Usually an abscess develops on the skin and becomes a painful mass that is red, warm, and tender. If the abscess forms under the skin, you may feel a moveable soft area under the skin. Some abscesses break open (rupture) on their own, but most will continue to get worse without care. The infection can spread deeper into the body and eventually into the bloodstream, causing you to feel ill.  DIAGNOSIS  Your caregiver will take your medical history and perform a physical exam. A sample of fluid may also be taken from the abscess to  determine what is causing your infection. TREATMENT  Your caregiver may prescribe antibiotic medicines to fight the infection. However, taking antibiotics alone usually does not cure an abscess. Your caregiver may need to make a small cut (incision) in the abscess to drain the pus. In some cases, gauze is packed into the abscess to reduce pain and to continue draining the area. HOME CARE INSTRUCTIONS   Only take over-the-counter or prescription medicines for pain, discomfort, or fever as directed by your caregiver.  If you were prescribed antibiotics, take them as directed. Finish them even if you start to feel better.  If gauze is used, follow your caregiver's directions for changing the gauze.  To avoid spreading the infection:  Keep your draining abscess covered with a bandage.  Wash your hands well.  Do not share personal care items, towels, or whirlpools with others.  Avoid skin contact with others.  Keep your skin and clothes clean around the abscess.  Keep all follow-up appointments as directed by your caregiver. SEEK MEDICAL CARE IF:   You have increased pain, swelling, redness, fluid drainage, or bleeding.  You have muscle aches, chills, or a general ill feeling.  You have a fever. MAKE SURE YOU:   Understand these instructions.  Will watch your condition.  Will get help right away if you are not doing well or get worse.   This information is not intended to replace advice given to you by your health care provider. Make sure you discuss any questions you have with your health care provider.   Document  Released: 06/19/2005 Document Revised: 03/10/2012 Document Reviewed: 11/22/2011 Elsevier Interactive Patient Education 2016 Else[employer] Inc[employer]  Incision and Drainage Incision and drainage is a procedure in which a sac-like structure (cystic structure) is opened and drained. The area to be drained usually contains material such as pus, fluid, or blood.  LET YOUR  CAREGIVER KNOW ABOUT:   Allergies to medicine.  Medicines taken, including vitamins, herbs, eyedrops, over-the-counter medicines, and creams.  Use of steroids (by mouth or creams).  Previous problems with anesthetics or numbing medicines.  History of bleeding problems or blood clots.  Previous surgery.  Other health problems, including diabetes and kidney problems.  Possibility of pregnancy, if this applies. RISKS AND COMPLICATIONS  Pain.  Bleeding.  Scarring.  Infection. BEFORE THE PROCEDURE  You may need to have an ultrasound or other imaging tests to see how large or deep your cystic structure is. Blood tests may also be used to determine if you have an infection or how severe the infection is. You may need to have a tetanus shot. PROCEDURE  The affected area is cleaned with a cleaning fluid. The cyst area will then be numbed with a medicine (local anesthetic). A small incision will be made in the cystic structure. A syringe or catheter may be used to drain the contents of the cystic structure, or the contents may be squeezed out. The area will then be flushed with a cleansing solution. After cleansing the area, it is often gently packed with a gauze or another wound dressing. Once it is packed, it will be covered with gauze and tape or some other type of wound dressing. AFTER THE PROCEDURE   Often, you will be allowed to go home right after the procedure.  You may be given antibiotic medicine to prevent or heal an infection.  If the area was packed with gauze or some other wound dressing, you will likely need to come back in 1 to 2 days to get it removed.  The area should heal in about 14 days.   This information is not intended to replace advice given to you by your health care provider. Make sure you discuss any questions you have with your health care provider.   Document Released: 03/05/2001 Document Revised: 03/10/2012 Document Reviewed: 11/04/2011 Elsevier  Interactive Patient Education [employer].

## 2015-12-25 LAB — WOUND CULTURE: SPECIAL REQUESTS: NORMAL

## 2015-12-27 LAB — ANAEROBIC CULTURE: Special Requests: NORMAL

## 2016-01-15 ENCOUNTER — Emergency Department: Payer: Managed Care, Other (non HMO)

## 2016-01-15 ENCOUNTER — Other Ambulatory Visit: Payer: Self-pay

## 2016-01-15 ENCOUNTER — Encounter: Payer: Self-pay | Admitting: *Deleted

## 2016-01-15 ENCOUNTER — Emergency Department
Admission: EM | Admit: 2016-01-15 | Discharge: 2016-01-15 | Disposition: A | Discharge status: Home / Self Care | Payer: Managed Care, Other (non HMO) | Attending: Emergency Medicine | Admitting: Emergency Medicine

## 2016-01-15 DIAGNOSIS — K2970 Gastritis, unspecified, without bleeding: Secondary | ICD-10-CM

## 2016-01-15 DIAGNOSIS — K8020 Calculus of gallbladder without cholecystitis without obstruction: Secondary | ICD-10-CM | POA: Diagnosis not present

## 2016-01-15 DIAGNOSIS — R109 Unspecified abdominal pain: Secondary | ICD-10-CM

## 2016-01-15 DIAGNOSIS — Z7984 Long term (current) use of oral hypoglycemic drugs: Secondary | ICD-10-CM | POA: Diagnosis not present

## 2016-01-15 DIAGNOSIS — R1013 Epigastric pain: Secondary | ICD-10-CM | POA: Diagnosis present

## 2016-01-15 DIAGNOSIS — Z79899 Other long term (current) drug therapy: Secondary | ICD-10-CM | POA: Insufficient documentation

## 2016-01-15 HISTORY — DX: Polycystic ovarian syndrome: E28.2

## 2016-01-15 LAB — URINALYSIS COMPLETE WITH MICROSCOPIC (ARMC ONLY)
Bacteria, UA: NONE SEEN
Bilirubin Urine: NEGATIVE
GLUCOSE, UA: NEGATIVE mg/dL
Hgb urine dipstick: NEGATIVE
Ketones, ur: NEGATIVE mg/dL
Leukocytes, UA: NEGATIVE
Nitrite: NEGATIVE
PROTEIN: NEGATIVE mg/dL
RBC / HPF: NONE SEEN RBC/hpf (ref 0–5)
SPECIFIC GRAVITY, URINE: 1.013 (ref 1.005–1.030)
pH: 8 (ref 5.0–8.0)

## 2016-01-15 LAB — TROPONIN I

## 2016-01-15 LAB — CBC
HCT: 36.6 % (ref 35.0–47.0)
HEMOGLOBIN: 12.7 g/dL (ref 12.0–16.0)
MCH: 28.3 pg (ref 26.0–34.0)
MCHC: 34.6 g/dL (ref 32.0–36.0)
MCV: 81.8 fL (ref 80.0–100.0)
PLATELETS: 236 10*3/uL (ref 150–440)
RBC: 4.48 MIL/uL (ref 3.80–5.20)
RDW: 13.8 % (ref 11.5–14.5)
WBC: 11.4 10*3/uL — ABNORMAL HIGH (ref 3.6–11.0)

## 2016-01-15 LAB — COMPREHENSIVE METABOLIC PANEL
ALBUMIN: 3.7 g/dL (ref 3.5–5.0)
ALK PHOS: 43 U/L (ref 38–126)
ALT: 18 U/L (ref 14–54)
AST: 22 U/L (ref 15–41)
Anion gap: 11 (ref 5–15)
BUN: 11 mg/dL (ref 6–20)
CALCIUM: 9.4 mg/dL (ref 8.9–10.3)
CHLORIDE: 104 mmol/L (ref 101–111)
CO2: 22 mmol/L (ref 22–32)
CREATININE: 0.68 mg/dL (ref 0.44–1.00)
GFR calc Af Amer: >60 mL/min (ref >60)
GFR calc non Af Amer: >60 mL/min (ref >60)
GLUCOSE: 116 mg/dL — AB (ref 65–99)
Potassium: 4.3 mmol/L (ref 3.5–5.1)
SODIUM: 137 mmol/L (ref 135–145)
Total Bilirubin: 0.3 mg/dL (ref 0.3–1.2)
Total Protein: 7.6 g/dL (ref 6.5–8.1)

## 2016-01-15 LAB — POCT PREGNANCY, URINE: Preg Test, Ur: NEGATIVE

## 2016-01-15 LAB — LIPASE, BLOOD: LIPASE: 23 U/L (ref 11–51)

## 2016-01-15 MED ORDER — ONDANSETRON HCL 4 MG/2ML IJ SOLN
4.0000 mg | Freq: Once | INTRAMUSCULAR | Status: AC
  Administered 2016-01-15: 4 mg via INTRAVENOUS
  Filled 2016-01-15: qty 2

## 2016-01-15 MED ORDER — ONDANSETRON 4 MG PO TBDP: 4.0000 mg | ORAL_TABLET | Freq: Three times a day (TID) | ORAL | Status: DC | PRN

## 2016-01-15 MED ORDER — SUCRALFATE 1 G PO TABS: 1.0000 g | ORAL_TABLET | Freq: Two times a day (BID) | ORAL | Status: DC

## 2016-01-15 MED ORDER — MORPHINE SULFATE (PF) 4 MG/ML IV SOLN
4.0000 mg | Freq: Once | INTRAVENOUS | Status: AC
  Administered 2016-01-15: 4 mg via INTRAVENOUS
  Filled 2016-01-15: qty 1

## 2016-01-15 MED ORDER — SODIUM CHLORIDE 0.9 % IV BOLUS (SEPSIS)
1000.0000 mL | Freq: Once | INTRAVENOUS | Status: AC
  Administered 2016-01-15: 1000 mL via INTRAVENOUS

## 2016-01-15 MED ORDER — OXYCODONE-ACETAMINOPHEN 5-325 MG PO TABS: 1.0000 | ORAL_TABLET | Freq: Four times a day (QID) | ORAL | Status: DC | PRN

## 2016-01-15 MED ORDER — GI COCKTAIL ~~LOC~~
30.0000 mL | Freq: Once | ORAL | Status: AC
  Administered 2016-01-15: 30 mL via ORAL

## 2016-01-15 NOTE — Discharge Instructions (Signed)
Abdominal Pain, Adult Many things can cause abdominal pain. Usually, abdominal pain is not caused by a disease and will improve without treatment. It can often be observed and treated at home. Your health care provider will do a physical exam and possibly order blood tests and X-rays to help determine the seriousness of your pain. However, in many cases, more time must pass before a clear cause of the pain can be found. Before that point, your health care provider may not know if you need more testing or further treatment. HOME CARE INSTRUCTIONS Monitor your abdominal pain for any changes. The following actions may help to alleviate any discomfort you are experiencing:  Only take over-the-counter or prescription medicines as directed by your health care provider.  Do not take laxatives unless directed to do so by your health care provider.  Try a clear liquid diet (broth, tea, or water) as directed by your health care provider. Slowly move to a bland diet as tolerated. SEEK MEDICAL CARE IF:  You have unexplained abdominal pain.  You have abdominal pain associated with nausea or diarrhea.  You have pain when you urinate or have a bowel movement.  You experience abdominal pain that wakes you in the night.  You have abdominal pain that is worsened or improved by eating food.  You have abdominal pain that is worsened with eating fatty foods.  You have a fever. SEEK IMMEDIATE MEDICAL CARE IF:  Your pain does not go away within 2 hours.  You keep throwing up (vomiting).  Your pain is felt only in portions of the abdomen, such as the right side or the left lower portion of the abdomen.  You pass bloody or black tarry stools. MAKE SURE YOU:  Understand these instructions.  Will watch your condition.  Will get help right away if you are not doing well or get worse.   This information is not intended to replace advice given to you by your health care provider. Make sure you discuss  any questions you have with your health care provider.   Document Released: 06/19/2005 Document Revised: 05/31/2015 Document Reviewed: 05/19/2013 Elsevier Interactive Patient Education 2016 [place].  Biliary Colic Biliary colic is a pain in the upper abdomen. The pain:  Is usually felt on the right side of the abdomen, but it may also be felt in the center of the abdomen, just below the breastbone (sternum).  May spread back toward the right shoulder blade.  May be steady or irregular.  May be accompanied by nausea and vomiting. Most of the time, the pain goes away in 1-5 hours. After the most intense pain passes, the abdomen may continue to ache mildly for about 24 hours. Biliary colic is caused by a blockage in the bile duct. The bile duct is a pathway that carries bile--a liquid that helps to digest fats--from the gallbladder to the small intestine. Biliary colic usually occurs after eating, when the digestive system demands bile. The pain develops when muscle cells contract forcefully to try to move the blockage so that bile can get by. HOME CARE INSTRUCTIONS  Take medicines only as directed by your health care provider.  Drink enough fluid to keep your urine clear or pale yellow.  Avoid fatty, greasy, and fried foods. These kinds of foods increase your body's demand for bile.  Avoid any foods that make your pain worse.  Avoid overeating.  Avoid having a large meal after fasting. SEEK MEDICAL CARE IF:  You develop a fever.  Your pain gets worse.  You vomit.  You develop nausea that prevents you from eating and drinking. SEEK IMMEDIATE MEDICAL CARE IF:  You suddenly develop a fever and shaking chills.  You develop a yellowish discoloration (jaundice) of:  Skin.  Whites of the eyes.  Mucous membranes.  You have continuous or severe pain that is not relieved with medicines.  You have nausea and vomiting that is not relieved with medicines.  You develop  dizziness or you faint.   This information is not intended to replace advice given to you by your health care provider. Make sure you discuss any questions you have with your health care provider.   Document Released: 02/10/2006 Document Revised: 01/24/2015 Document Reviewed: 06/21/2014 Elsevier Interactive Patient Education 2016 [employer].  Cholelithiasis Cholelithiasis (also called gallstones) is a form of gallbladder disease in which gallstones form in your gallbladder. The gallbladder is an organ that stores bile made in the liver, which helps digest fats. Gallstones begin as small crystals and slowly grow into stones. Gallstone pain occurs when the gallbladder spasms and a gallstone is blocking the duct. Pain can also occur when a stone passes out of the duct.  RISK FACTORS  Being female.   Having multiple pregnancies. Health care providers sometimes advise removing diseased gallbladders before future pregnancies.   Being obese.  Eating a diet heavy in fried foods and fat.   Being older than 78 years and increasing age.   Prolonged use of medicines containing female hormones.   Having diabetes mellitus.   Rapidly losing weight.   Having a family history of gallstones (heredity).  SYMPTOMS  Nausea.   Vomiting.  Abdominal pain.   Yellowing of the skin (jaundice).   Sudden pain. It may persist from several minutes to several hours.  Fever.   Tenderness to the touch. In some cases, when gallstones do not move into the bile duct, people have no pain or symptoms. These are called "silent" gallstones.  TREATMENT Silent gallstones do not need treatment. In severe cases, emergency surgery may be required. Options for treatment include:  Surgery to remove the gallbladder. This is the most common treatment.  Medicines. These do not always work and may take 6-12 months or more to work.  Shock wave treatment (extracorporeal biliary lithotripsy). In this  treatment an ultrasound machine sends shock waves to the gallbladder to break gallstones into smaller pieces that can pass into the intestines or be dissolved by medicine. HOME CARE INSTRUCTIONS   Only take over-the-counter or prescription medicines for pain, discomfort, or fever as directed by your health care provider.   Follow a low-fat diet until seen again by your health care provider. Fat causes the gallbladder to contract, which can result in pain.   Follow up with your health care provider as directed. Attacks are almost always recurrent and surgery is usually required for permanent treatment.  SEEK IMMEDIATE MEDICAL CARE IF:   Your pain increases and is not controlled by medicines.   You have a fever or persistent symptoms for more than 2-3 days.   You have a fever and your symptoms suddenly get worse.   You have persistent nausea and vomiting.  MAKE SURE YOU:   Understand these instructions.  Will watch your condition.  Will get help right away if you are not doing well or get worse.   This information is not intended to replace advice given to you by your health care provider. Make sure you discuss any questions you  have with your health care provider.   Document Released: 09/05/2005 Document Revised: 05/12/2013 Document Reviewed: 03/03/2013 Elsevier Interactive Patient Education [employer].

## 2016-01-15 NOTE — ED Notes (Signed)
Discharge instructions reviewed with patient. Patient verbalized understanding. Patient ambulated to lobby without difficulty.   

## 2016-01-15 NOTE — ED Notes (Signed)
Pt c/o epigastric pain that radiates to chest. Pt denies nausea and vomiting. Pt had similar pain that was not as bad a few days ago.

## 2016-01-15 NOTE — ED Provider Notes (Signed)
[facility] Emergency Department Provider Note  ____________________________________________  Time seen: Approximately 236 AM  I have reviewed the triage vital signs and the nursing notes.   HISTORY  Chief Complaint Abdominal Pain    HPI Barbara Horn is a 35 y.o. female who comes into the hospital today with epigastric pain. She reports that she has this pain radiates to her right. She reports that she started feeling uncomfortable around 8 PM. She reports that she took a Gas-X and then at times and omeprazole. She reports that nothing helped. The pain has been steadily worsening and she rates the pain a 7 out of 10 in intensity. The patient has had similar symptoms before but it was treated with a GI cocktail and was fine.. This happened a few years ago. The patient reports that she does have a gallstone and she had an MRI inside on the report. The patient has no nausea or vomiting no chest pain and no shortness of breath. She reports that she couldn't tolerate it anymore so she decided to come into the hospital.   Past Medical History  Diagnosis Date  . Breast mass     Left Lump 9 o'clock per physician  . PCOS (polycystic ovarian syndrome)     Patient Active Problem List   Diagnosis Date Noted  . Ovarian mass, right 07/05/2015  . History of PCOS 06/28/2015  . Morbid obesity ([place]) 06/28/2015    Past Surgical History  Procedure Laterality Date  . Tonsillectomy      Current Outpatient Rx  Name  Route  Sig  Dispense  Refill  . fexofenadine-pseudoephedrine (ALLEGRA-D) 60-120 MG 12 hr tablet   Oral   Take 1 tablet by mouth 2 (two) times daily.         . fluticasone (FLONASE) 50 MCG/ACT nasal spray   Each Nare   Place 2 sprays into both nostrils daily.   16 g   6   . levonorgestrel-ethinyl estradiol (ALTAVERA) 0.15-30 MG-MCG tablet   Oral   Take 1 tablet by mouth daily.         . metFORMIN (GLUCOPHAGE) 1000 MG tablet   Oral   Take 1,000  mg by mouth every evening.          . ondansetron (ZOFRAN ODT) 4 MG disintegrating tablet   Oral   Take 1 tablet (4 mg total) by mouth every 8 (eight) hours as needed for nausea or vomiting.   20 tablet   0   . oxyCODONE-acetaminophen (ROXICET) 5-325 MG tablet   Oral   Take 1 tablet by mouth every 6 (six) hours as needed.   12 tablet   0   . sucralfate (CARAFATE) 1 g tablet   Oral   Take 1 tablet (1 g total) by mouth 2 (two) times daily.   20 tablet   0     Allergies Bee venom and Penicillins  History reviewed. No pertinent family history.  Social History Social History  Substance Use Topics  . Smoking status: Never Smoker   . Smokeless tobacco: Never Used  . Alcohol Use: No    Review of Systems Constitutional: No fever/chills Eyes: No visual changes. ENT: No sore throat. Cardiovascular: Denies chest pain. Respiratory: Denies shortness of breath. Gastrointestinal: abdominal pain.  No nausea, no vomiting.  No diarrhea.  No constipation. Genitourinary: Negative for dysuria. Musculoskeletal: Negative for back pain. Skin: Negative for rash. Neurological: Negative for headaches, focal weakness or numbness.  10-point ROS otherwise  negative.  ____________________________________________   PHYSICAL EXAM:  VITAL SIGNS: ED Triage Vitals  Enc Vitals Group     BP 01/15/16 0205 148/90 mmHg     Pulse Rate 01/15/16 0205 66     Resp 01/15/16 0205 18     Temp 01/15/16 0205 98.3 F (36.8 C)     Temp Source 01/15/16 0205 Oral     SpO2 01/15/16 0205 100 %     Weight 01/15/16 0205 285 lb (129.275 kg)     Height 01/15/16 0205 5\' 2"  (1.575 m)     Head Cir --      Peak Flow --      Pain Score 01/15/16 0206 7     Pain Loc --      Pain Edu? --      Excl. in [place]? --     Constitutional: Alert and oriented. Well appearing and in Moderate distress. Eyes: Conjunctivae are normal. PERRL. EOMI. Head: Atraumatic. Nose: No congestion/rhinnorhea. Mouth/Throat: Mucous  membranes are moist.  Oropharynx non-erythematous. Cardiovascular: Normal rate, regular rhythm. Grossly normal heart sounds.  Good peripheral circulation. Respiratory: Normal respiratory effort.  No retractions. Lungs CTAB. Gastrointestinal: Soft with epigastric and right upper quadrant tenderness to palpation. No distention. Positive bowel sounds Musculoskeletal: No lower extremity tenderness nor edema.   Neurologic:  Normal speech and language.  Skin:  Skin is warm, dry and intact.  Psychiatric: Mood and affect are normal.   ____________________________________________   LABS (all labs ordered are listed, but only abnormal results are displayed)  Labs Reviewed  COMPREHENSIVE METABOLIC PANEL - Abnormal; Notable for the following:    Glucose, Bld 116 (*)    All other components within normal limits  CBC - Abnormal; Notable for the following:    WBC 11.4 (*)    All other components within normal limits  URINALYSIS COMPLETEWITH MICROSCOPIC ([facility] ONLY) - Abnormal; Notable for the following:    Color, Urine STRAW (*)    APPearance CLEAR (*)    Squamous Epithelial / LPF 0-5 (*)    All other components within normal limits  LIPASE, BLOOD  TROPONIN I  TROPONIN I  POC URINE PREG, ED  POCT PREGNANCY, URINE   ____________________________________________  EKG  ED ECG REPORT I, Loney Hering, the attending physician, personally viewed and interpreted this ECG.   Date: 01/15/2016  EKG Time: 219  Rate: 61  Rhythm: normal sinus rhythm  Axis: normal  Intervals:none  ST&T Change: none  ____________________________________________  RADIOLOGY  RUQ [place]: Stone fixed in the gallbladder neck, No indication of acute cholecystitis ____________________________________________   PROCEDURES  Procedure(s) performed: None  Critical Care performed: No  ____________________________________________   INITIAL IMPRESSION / ASSESSMENT AND PLAN / ED COURSE  Pertinent labs & imaging  results that were available during my care of the patient were reviewed by me and considered in my medical decision making (see chart for details).  This is a 35 year old female who comes into the hospital today with epigastric pain. The patient reports she's had this pain a few times previously but never this intense. We did perform an ultrasound which showed a stone in the gallbladder neck but the patient is not having any elevation of her liver enzymes with bilirubin. The patient does have white blood cell count of 11 but she does not have any signs of cholecystitis. The patient did receive a dose of morphine and her pain was improved. I then gave her a GI cocktail. She will be reassessed to  determine if her pain is improved to follow-up with surgery outpatient.  The patient reports her pain is now 1. The patient's blood work is unremarkable. I did repeat the patient's troponin and is also unremarkable. I cleaned the patient that she needs to follow-up with surgery electively to have her gallbladder removed. The patient at this time has no further complaints or concerns and she feels comfortable being discharged to home. The patient will be discharged to follow-up with surgery. I have discussed the biliary diet which is decreasing fatty foods as well as the diet for gastritis. The patient reports that she has been on Nexium before and she may consider restarting her Nexium. ____________________________________________   FINAL CLINICAL IMPRESSION(S) / ED DIAGNOSES  Final diagnoses:  Abdominal pain  Gallstones  Gastritis      Loney Hering, MD 01/15/16 [phone]

## 2016-01-16 ENCOUNTER — Encounter: Payer: Self-pay | Admitting: Surgery

## 2016-01-16 ENCOUNTER — Ambulatory Visit (INDEPENDENT_AMBULATORY_CARE_PROVIDER_SITE_OTHER): Payer: 59 | Admitting: Surgery

## 2016-01-16 VITALS — BP 133/85 | HR 60 | Temp 97.7°F | Ht 61.0 in | Wt 292.0 lb

## 2016-01-16 DIAGNOSIS — K8020 Calculus of gallbladder without cholecystitis without obstruction: Secondary | ICD-10-CM | POA: Diagnosis not present

## 2016-01-16 NOTE — Patient Instructions (Signed)
We have provided a low fat diet for you. We will call you with a surgery date.

## 2016-01-17 ENCOUNTER — Telehealth: Payer: Self-pay | Admitting: Surgery

## 2016-01-17 NOTE — Telephone Encounter (Signed)
Pt advised of pre op date/time and sx date. Sx: 01/31/16 with Dr Pabon--Laparoscopic cholecystectomy. Pre op: 01/24/16 @ 11:30am--Office.   Patient made aware to call [phone], between 1-3:00pm the day before surgery, to find out what time to arrive.

## 2016-01-17 NOTE — Progress Notes (Signed)
Patient ID: Barbara Horn, female   DOB: 05-24-1981, 35 y.o.   MRN: [id number]  History of Present Illness Barbara Horn is a 35 y.o. female with recent hx of RUQ pain. Moderate and sharp pain, associated with nausea. Worsening w meals. She is super morbidly obese. U/S showed GS, no CBD dilation, nml LFTS, She has good cv performance but does have sleep apnea. No biliary obstruction. Pain now subsided and she has been on a bland diet  Past Medical History Past Medical History  Diagnosis Date  . Breast mass     Left Lump 9 o'clock per physician  . PCOS (polycystic ovarian syndrome)       Past Surgical History  Procedure Laterality Date  . Tonsillectomy      Allergies  Allergen Reactions  . Bee Venom Swelling  . Penicillins Rash and Other (See Comments)    Patient had reaction as a child and does not remember the details.    Current Outpatient Prescriptions  Medication Sig Dispense Refill  . fexofenadine-pseudoephedrine (ALLEGRA-D) 60-120 MG 12 hr tablet Take 1 tablet by mouth 2 (two) times daily.    . fluticasone (FLONASE) 50 MCG/ACT nasal spray Place 2 sprays into both nostrils daily. 16 g 6  . levonorgestrel-ethinyl estradiol (ALTAVERA) 0.15-30 MG-MCG tablet Take 1 tablet by mouth daily.    . metFORMIN (GLUCOPHAGE) 1000 MG tablet Take 1,000 mg by mouth every evening.     . metFORMIN (GLUCOPHAGE-XR) 500 MG 24 hr tablet Take 2 tablets by mouth daily after supper.  11  . ondansetron (ZOFRAN ODT) 4 MG disintegrating tablet Take 1 tablet (4 mg total) by mouth every 8 (eight) hours as needed for nausea or vomiting. (Patient not taking: Reported on 01/16/2016) 20 tablet 0  . oxyCODONE-acetaminophen (ROXICET) 5-325 MG tablet Take 1 tablet by mouth every 6 (six) hours as needed. (Patient not taking: Reported on 01/16/2016) 12 tablet 0  . sucralfate (CARAFATE) 1 g tablet Take 1 tablet (1 g total) by mouth 2 (two) times daily. 20 tablet 0   No current facility-administered medications for this  visit.    Family History Family History  Problem Relation Age of Onset  . Diabetes Mother   . Hypertension Mother   . Diabetes Father   . Hypertension Father      Social History Social History  Substance Use Topics  . Smoking status: Never Smoker   . Smokeless tobacco: Never Used  . Alcohol Use: No      ROS 10 pt ROS performed and is otherwise negative   Physical Exam Blood pressure 133/85, pulse 60, temperature 97.7 F (36.5 C), temperature source Oral, height 5\' 1"  (1.549 m), weight 132.45 kg (292 lb), last menstrual period 01/03/2016.  CONSTITUTIONAL: Morbidly obese in NAD EYES: Pupils equal, round, and reactive to light, Sclera non-icteric. EARS, NOSE, MOUTH AND THROAT: The oropharynx is clear. Oral mucosa is pink and moist. Hearing is intact to voice.  NECK: Trachea is midline, and there is no jugular venous distension. Thyroid is without palpable abnormalities. LYMPH NODES:  Lymph nodes in the neck are not enlarged. RESPIRATORY:  Lungs are clear, and breath sounds are equal bilaterally. Normal respiratory effort without pathologic use of accessory muscles. CARDIOVASCULAR: Heart is regular without murmurs, gallops, or rubs. GI: The abdomen is  soft, nontender, and nondistended. There were no palpable masses. There was no hepatosplenomegaly. There were normal bowel sounds. GU: Deferred MUSCULOSKELETAL:  Normal muscle strength and tone in all four extremities.  SKIN: Skin turgor is normal. There are no pathologic skin lesions.  NEUROLOGIC:  Motor and sensation is grossly normal.  Cranial nerves are grossly intact. PSYCH:  Alert and oriented to person, place and time. Affect is normal.  Data Reviewed I have personally reviewed the patient's imaging and medical records.    Assessment/Plan.   Symptomatic cholelithiasis in a patient with morbidly obesity. Scars with the patient in detail about her disease process. I do want her to lose some weight and I gave her  bariatric liquid diet to having the next 3 days. We'll schedule him tentatively for a laparoscopic cholecystectomy in about 4 weeks and we will make sure that anesthesia will see [place] for preoperatively secondary to a high risk airway. Discussed with the patient in detail about the surgery.The risks, benefits, complications, treatment options, and expected outcomes were discussed with the patient. The possibilities of bleeding, recurrent infection, finding a normal gallbladder, perforation of viscus organs, damage to surrounding structures, bile leak, abscess formation, needing a drain placed, the need for additional procedures, reaction to medication, pulmonary aspiration,  failure to diagnose a condition, the possible need to convert to an open procedure, and creating a complication requiring transfusion or operation were discussed with the patient. The patient and/or family concurred with the proposed plan, giving informed consent.  Extensive counseling provided  [facility] pabon, MD [place] 01/17/2016, 3:07 PM

## 2016-01-17 NOTE — Addendum Note (Signed)
Addended by: Caroleen Hamman F on: 01/17/2016 03:13 PM   Modules accepted: Orders

## 2016-01-19 ENCOUNTER — Encounter: Payer: Self-pay | Admitting: Physician Assistant

## 2016-01-19 ENCOUNTER — Ambulatory Visit: Payer: Self-pay | Admitting: Physician Assistant

## 2016-01-19 VITALS — BP 120/80 | HR 75 | Temp 98.0°F

## 2016-01-19 DIAGNOSIS — J0100 Acute maxillary sinusitis, unspecified: Secondary | ICD-10-CM

## 2016-01-19 MED ORDER — PSEUDOEPH-BROMPHEN-DM 30-2-10 MG/5ML PO SYRP: 5.0000 mL | ORAL_SOLUTION | Freq: Four times a day (QID) | ORAL | Status: DC | PRN

## 2016-01-19 MED ORDER — SULFAMETHOXAZOLE-TRIMETHOPRIM 800-160 MG PO TABS: 1.0000 | ORAL_TABLET | Freq: Two times a day (BID) | ORAL | Status: DC

## 2016-01-19 NOTE — Progress Notes (Signed)
   Subjective:Cough/Congestion    Patient ID: Barbara Horn, female    DOB: [birth year], 35 y.o.   MRN: [id number]  HPI Patient c/o sinus congestion and productive cough for one week. Denies fever/chill, or N/V/D.  patient use CPAP at night. Patient states except for Flonase no other palliative measure for this compliant.   Review of Systems Obesity and Sleep Apnea.    Objective:   Physical Exam Bilateral maxillary sinus guarding, edematous nasal turbinates, and post nasal drainage.  Neck supple, Lungs with bilateral Rales and Heart RRR.       Assessment & Plan:Sinus congestion/Bronchitis  Bactrim DS and Bromfed DM.  Follow up 3 days if no improvement.

## 2016-01-24 ENCOUNTER — Other Ambulatory Visit: Payer: Self-pay

## 2016-01-24 ENCOUNTER — Encounter
Admission: RE | Admit: 2016-01-24 | Discharge: 2016-01-24 | Disposition: A | Discharge status: Home / Self Care | Payer: Managed Care, Other (non HMO) | Source: Ambulatory Visit | Attending: Surgery | Admitting: Surgery

## 2016-01-24 DIAGNOSIS — Z01818 Encounter for other preprocedural examination: Secondary | ICD-10-CM | POA: Diagnosis not present

## 2016-01-24 HISTORY — DX: Sleep apnea, unspecified: G47.30

## 2016-01-24 HISTORY — DX: Other seasonal allergic rhinitis: J30.2

## 2016-01-24 NOTE — Patient Instructions (Signed)
  Your procedure is scheduled on:01/31/16 Report to Day Surgery. Medical mall second floor To find out your arrival time please call [phone] between 1PM - 3PM on 01/30/16  Remember: Instructions that are not followed completely may result in serious medical risk, up to and including death, or upon the discretion of your surgeon and anesthesiologist your surgery may need to be rescheduled.    _x___ 1. Do not eat food or drink liquids after midnight. No gum chewing or hard candies.     _x__ 2. No Alcohol for 24 hours before or after surgery.   ____ 3. Bring all medications with you on the day of surgery if instructed.    __x__ 4. Notify your doctor if there is any change in your medical condition     (cold, fever, infections).     Do not wear jewelry, make-up, hairpins, clips or nail polish.  Do not wear lotions, powders, or perfumes. You may wear deodorant.  Do not shave 48 hours prior to surgery. Men may shave face and neck.  Do not bring valuables to the hospital.    [facility] is not responsible for any belongings or valuables.               Contacts, dentures or bridgework may not be worn into surgery.  Leave your suitcase in the car. After surgery it may be brought to your room.  For patients admitted to the hospital, discharge time is determined by your                treatment team.   Patients discharged the day of surgery will not be allowed to drive home.   Please read over the following fact sheets that you were given:   Surgical Site Infection Prevention   ____ Take these medicines the morning of surgery with A SIP OF WATER:    1. none  2.   3.   4.  5.  6.  ____ Fleet Enema (as directed)   _x__ Use CHG Soap as directed  ____ Use inhalers on the day of surgery  _x___ Stop metformin 2 days prior to surgery    ____ Take 1/2 of usual insulin dose the night before surgery and none on the morning of surgery.   ____ Stop Coumadin/Plavix/aspirin on  ____  Stop Anti-inflammatories on   ____ Stop supplements until after surgery.    __x__ Bring C-Pap to the hospital.

## 2016-01-31 ENCOUNTER — Ambulatory Visit
Admission: RE | Admit: 2016-01-31 | Discharge: 2016-01-31 | Disposition: A | Discharge status: Home / Self Care | Payer: Managed Care, Other (non HMO) | Source: Ambulatory Visit | Attending: Surgery | Admitting: Surgery

## 2016-01-31 ENCOUNTER — Encounter
Admission: RE | Disposition: A | Discharge status: Home / Self Care | Payer: Self-pay | Source: Ambulatory Visit | Attending: Surgery

## 2016-01-31 ENCOUNTER — Ambulatory Visit: Payer: Managed Care, Other (non HMO) | Admitting: Anesthesiology

## 2016-01-31 ENCOUNTER — Encounter: Payer: Self-pay | Admitting: *Deleted

## 2016-01-31 DIAGNOSIS — Z8249 Family history of ischemic heart disease and other diseases of the circulatory system: Secondary | ICD-10-CM | POA: Diagnosis not present

## 2016-01-31 DIAGNOSIS — Z833 Family history of diabetes mellitus: Secondary | ICD-10-CM | POA: Insufficient documentation

## 2016-01-31 DIAGNOSIS — Z79899 Other long term (current) drug therapy: Secondary | ICD-10-CM | POA: Diagnosis not present

## 2016-01-31 DIAGNOSIS — E282 Polycystic ovarian syndrome: Secondary | ICD-10-CM | POA: Insufficient documentation

## 2016-01-31 DIAGNOSIS — Z6841 Body Mass Index (BMI) 40.0 and over, adult: Secondary | ICD-10-CM | POA: Diagnosis not present

## 2016-01-31 DIAGNOSIS — K8020 Calculus of gallbladder without cholecystitis without obstruction: Secondary | ICD-10-CM | POA: Diagnosis not present

## 2016-01-31 DIAGNOSIS — Z880 Allergy status to penicillin: Secondary | ICD-10-CM | POA: Diagnosis not present

## 2016-01-31 DIAGNOSIS — K811 Chronic cholecystitis: Secondary | ICD-10-CM | POA: Diagnosis present

## 2016-01-31 DIAGNOSIS — G4730 Sleep apnea, unspecified: Secondary | ICD-10-CM | POA: Diagnosis not present

## 2016-01-31 DIAGNOSIS — Z7984 Long term (current) use of oral hypoglycemic drugs: Secondary | ICD-10-CM | POA: Insufficient documentation

## 2016-01-31 DIAGNOSIS — K8010 Calculus of gallbladder with chronic cholecystitis without obstruction: Secondary | ICD-10-CM | POA: Insufficient documentation

## 2016-01-31 DIAGNOSIS — Z91030 Bee allergy status: Secondary | ICD-10-CM | POA: Diagnosis not present

## 2016-01-31 HISTORY — PX: CHOLECYSTECTOMY: SHX55

## 2016-01-31 LAB — POCT PREGNANCY, URINE: Preg Test, Ur: NEGATIVE

## 2016-01-31 SURGERY — LAPAROSCOPIC CHOLECYSTECTOMY
Anesthesia: General | Site: Abdomen | Wound class: Clean Contaminated

## 2016-01-31 MED ORDER — ACETAMINOPHEN 10 MG/ML IV SOLN
INTRAVENOUS | Status: AC
  Filled 2016-01-31: qty 100

## 2016-01-31 MED ORDER — FENTANYL CITRATE (PF) 100 MCG/2ML IJ SOLN
25.0000 ug | INTRAMUSCULAR | Status: DC | PRN
  Administered 2016-01-31: 25 ug via INTRAVENOUS
  Administered 2016-01-31: 50 ug via INTRAVENOUS
  Administered 2016-01-31: 25 ug via INTRAVENOUS

## 2016-01-31 MED ORDER — ALBUTEROL SULFATE HFA 108 (90 BASE) MCG/ACT IN AERS
INHALATION_SPRAY | RESPIRATORY_TRACT | Status: DC | PRN
  Administered 2016-01-31: 6 via RESPIRATORY_TRACT

## 2016-01-31 MED ORDER — METOPROLOL TARTRATE 5 MG/5ML IV SOLN
INTRAVENOUS | Status: DC | PRN
Start: 2016-01-31 — End: 2016-01-31
  Administered 2016-01-31: 3 mg via INTRAVENOUS

## 2016-01-31 MED ORDER — ACETAMINOPHEN 10 MG/ML IV SOLN
INTRAVENOUS | Status: DC | PRN
  Administered 2016-01-31: 1000 mg via INTRAVENOUS

## 2016-01-31 MED ORDER — GLYCOPYRROLATE 0.2 MG/ML IJ SOLN
INTRAMUSCULAR | Status: DC | PRN
  Administered 2016-01-31: 0.2 mg via INTRAVENOUS

## 2016-01-31 MED ORDER — ONDANSETRON HCL 4 MG/2ML IJ SOLN
INTRAMUSCULAR | Status: DC | PRN
  Administered 2016-01-31: 4 mg via INTRAVENOUS

## 2016-01-31 MED ORDER — OXYCODONE-ACETAMINOPHEN 7.5-325 MG PO TABS: 1.0000 | ORAL_TABLET | ORAL | Status: DC | PRN

## 2016-01-31 MED ORDER — DEXAMETHASONE SODIUM PHOSPHATE 10 MG/ML IJ SOLN
INTRAMUSCULAR | Status: DC | PRN
  Administered 2016-01-31: 5 mg via INTRAVENOUS

## 2016-01-31 MED ORDER — LIDOCAINE HCL (CARDIAC) 20 MG/ML IV SOLN
INTRAVENOUS | Status: DC | PRN
  Administered 2016-01-31: 40 mg via INTRAVENOUS

## 2016-01-31 MED ORDER — MIDAZOLAM HCL 2 MG/2ML IJ SOLN
INTRAMUSCULAR | Status: DC | PRN
  Administered 2016-01-31: 2 mg via INTRAVENOUS

## 2016-01-31 MED ORDER — PROPOFOL 10 MG/ML IV BOLUS
INTRAVENOUS | Status: DC | PRN
  Administered 2016-01-31: 200 mg via INTRAVENOUS

## 2016-01-31 MED ORDER — FENTANYL CITRATE (PF) 100 MCG/2ML IJ SOLN
INTRAMUSCULAR | Status: DC | PRN
  Administered 2016-01-31: 100 ug via INTRAVENOUS

## 2016-01-31 MED ORDER — BUPIVACAINE-EPINEPHRINE 0.25% -1:200000 IJ SOLN
INTRAMUSCULAR | Status: DC | PRN
  Administered 2016-01-31: 20 mL

## 2016-01-31 MED ORDER — SUCCINYLCHOLINE CHLORIDE 20 MG/ML IJ SOLN
INTRAMUSCULAR | Status: DC | PRN
  Administered 2016-01-31: 150 mg via INTRAVENOUS

## 2016-01-31 MED ORDER — BUPIVACAINE-EPINEPHRINE (PF) 0.25% -1:200000 IJ SOLN
INTRAMUSCULAR | Status: AC
  Filled 2016-01-31: qty 30

## 2016-01-31 MED ORDER — CEFAZOLIN SODIUM 10 G IJ SOLR
3.0000 g | INTRAMUSCULAR | Status: AC
  Administered 2016-01-31: 3 g via INTRAVENOUS
  Filled 2016-01-31: qty 3000

## 2016-01-31 MED ORDER — FENTANYL CITRATE (PF) 100 MCG/2ML IJ SOLN
INTRAMUSCULAR | Status: AC
  Administered 2016-01-31: 50 ug via INTRAVENOUS
  Filled 2016-01-31: qty 2

## 2016-01-31 MED ORDER — ONDANSETRON HCL 4 MG/2ML IJ SOLN: 4.0000 mg | Freq: Once | INTRAMUSCULAR | Status: DC | PRN

## 2016-01-31 MED ORDER — SODIUM CHLORIDE 0.9 % IV SOLN
INTRAVENOUS | Status: DC
  Administered 2016-01-31 (×2): via INTRAVENOUS

## 2016-01-31 MED ORDER — SUGAMMADEX SODIUM 200 MG/2ML IV SOLN
INTRAVENOUS | Status: DC | PRN
  Administered 2016-01-31: 260 mg via INTRAVENOUS

## 2016-01-31 MED ORDER — KETOROLAC TROMETHAMINE 30 MG/ML IJ SOLN
INTRAMUSCULAR | Status: DC | PRN
  Administered 2016-01-31: 30 mg via INTRAVENOUS

## 2016-01-31 MED ORDER — FAMOTIDINE 20 MG PO TABS
ORAL_TABLET | ORAL | Status: AC
  Filled 2016-01-31: qty 1

## 2016-01-31 MED ORDER — CHLORHEXIDINE GLUCONATE 4 % EX LIQD: 1.0000 "application " | Freq: Once | CUTANEOUS | Status: DC

## 2016-01-31 MED ORDER — FAMOTIDINE 20 MG PO TABS
20.0000 mg | ORAL_TABLET | Freq: Once | ORAL | Status: AC
  Administered 2016-01-31: 20 mg via ORAL

## 2016-01-31 MED ORDER — ROCURONIUM BROMIDE 100 MG/10ML IV SOLN
INTRAVENOUS | Status: DC | PRN
  Administered 2016-01-31: 20 mg via INTRAVENOUS
  Administered 2016-01-31: 5 mg via INTRAVENOUS

## 2016-01-31 SURGICAL SUPPLY — 40 items
APPLIER CLIP 5 13 M/L LIGAMAX5 (MISCELLANEOUS) ×3
BLADE SURG 15 STRL LF DISP TIS (BLADE) ×1 IMPLANT
BLADE SURG 15 STRL SS (BLADE) ×2
CANISTER SUCT 1200ML W/VALVE (MISCELLANEOUS) ×3 IMPLANT
CHLORAPREP W/TINT 26ML (MISCELLANEOUS) ×3 IMPLANT
CHOLANGIOGRAM CATH TAUT (CATHETERS) IMPLANT
CLEANER CAUTERY TIP 5X5 PAD (MISCELLANEOUS) ×1 IMPLANT
CLIP APPLIE 5 13 M/L LIGAMAX5 (MISCELLANEOUS) ×1 IMPLANT
DECANTER SPIKE VIAL GLASS SM (MISCELLANEOUS) IMPLANT
DEVICE TROCAR PUNCTURE CLOSURE (ENDOMECHANICALS) IMPLANT
DRAPE C-ARM XRAY 36X54 (DRAPES) IMPLANT
ELECT REM PT RETURN 9FT ADLT (ELECTROSURGICAL) ×3
ELECTRODE REM PT RTRN 9FT ADLT (ELECTROSURGICAL) ×1 IMPLANT
ENDOPOUCH RETRIEVER 10 (MISCELLANEOUS) ×3 IMPLANT
GLOVE BIO SURGEON STRL SZ7 (GLOVE) ×24 IMPLANT
GOWN STRL REUS W/ TWL LRG LVL3 (GOWN DISPOSABLE) ×3 IMPLANT
GOWN STRL REUS W/TWL LRG LVL3 (GOWN DISPOSABLE) ×6
IRRIGATION STRYKERFLOW (MISCELLANEOUS) ×1 IMPLANT
IRRIGATOR STRYKERFLOW (MISCELLANEOUS) ×3
IV CATH ANGIO 12GX3 LT BLUE (NEEDLE) ×3 IMPLANT
IV SOD CHL 0.9% 1000ML (IV SOLUTION) ×3 IMPLANT
L-HOOK LAP DISP 36CM (ELECTROSURGICAL) ×3
LHOOK LAP DISP 36CM (ELECTROSURGICAL) ×1 IMPLANT
LIQUID BAND (GAUZE/BANDAGES/DRESSINGS) ×3 IMPLANT
NEEDLE HYPO 22GX1.5 SAFETY (NEEDLE) ×3 IMPLANT
PACK LAP CHOLECYSTECTOMY (MISCELLANEOUS) ×3 IMPLANT
PAD CLEANER CAUTERY TIP 5X5 (MISCELLANEOUS) ×2
PENCIL ELECTRO HAND CTR (MISCELLANEOUS) ×3 IMPLANT
SCISSORS METZENBAUM CVD 33 (INSTRUMENTS) ×3 IMPLANT
SLEEVE ENDOPATH XCEL 5M (ENDOMECHANICALS) ×9 IMPLANT
STOPCOCK 3 WAY  REPLAC (MISCELLANEOUS) IMPLANT
SUT ETHIBOND 0 MO6 C/R (SUTURE) IMPLANT
SUT MNCRL AB 4-0 PS2 18 (SUTURE) ×3 IMPLANT
SUT VIC AB 0 CT2 27 (SUTURE) IMPLANT
SUT VICRYL 0 AB UR-6 (SUTURE) ×6 IMPLANT
SYR 20CC LL (SYRINGE) ×3 IMPLANT
TROCAR XCEL BLUNT TIP 100MML (ENDOMECHANICALS) ×3 IMPLANT
TROCAR XCEL NON-BLD 5MMX100MML (ENDOMECHANICALS) ×3 IMPLANT
TUBING INSUFFLATOR HI FLOW (MISCELLANEOUS) ×3 IMPLANT
WATER STERILE IRR 1000ML POUR (IV SOLUTION) ×3 IMPLANT

## 2016-01-31 NOTE — Anesthesia Preprocedure Evaluation (Signed)
Anesthesia Evaluation  Patient identified by MRN, date of birth, ID band Patient awake    Reviewed: Allergy & Precautions, H&P , NPO status , Patient's Chart, lab work & pertinent test results, reviewed documented beta blocker date and time   History of Anesthesia Complications Negative for: history of anesthetic complications  Airway Mallampati: III  TM Distance: >3 FB Neck ROM: full    Dental no notable dental hx. (+) Implants, Teeth Intact   Pulmonary neg shortness of breath, asthma (as a child) , sleep apnea and Continuous Positive Airway Pressure Ventilation , neg COPD, Recent URI , Resolved,    Pulmonary exam normal breath sounds clear to auscultation       Cardiovascular Exercise Tolerance: Good negative cardio ROS Normal cardiovascular exam Rhythm:regular Rate:Normal     Neuro/Psych negative neurological ROS  negative psych ROS   GI/Hepatic negative GI ROS, Neg liver ROS,   Endo/Other  neg diabetesMorbid obesity  Renal/GU negative Renal ROS  negative genitourinary   Musculoskeletal   Abdominal   Peds  Hematology negative hematology ROS (+)   Anesthesia Other Findings Past Medical History:   PCOS (polycystic ovarian syndrome)                           PCOS (polycystic ovarian syndrome)                           Sleep apnea                                                    Comment:cpcp   Seasonal allergies                                           Reproductive/Obstetrics negative OB ROS                             Anesthesia Physical Anesthesia Plan  ASA: III  Anesthesia Plan: General   Post-op Pain Management:    Induction:   Airway Management Planned:   Additional Equipment:   Intra-op Plan:   Post-operative Plan:   Informed Consent: I have reviewed the patients History and Physical, chart, labs and discussed the procedure including the risks, benefits and  alternatives for the proposed anesthesia with the patient or authorized representative who has indicated his/her understanding and acceptance.   Dental Advisory Given  Plan Discussed with: Anesthesiologist, CRNA and Surgeon  Anesthesia Plan Comments:         Anesthesia Quick Evaluation

## 2016-01-31 NOTE — Anesthesia Postprocedure Evaluation (Signed)
Anesthesia Post Note  Patient: Barbara Horn  Procedure(s) Performed: Procedure(s) (LRB): LAPAROSCOPIC CHOLECYSTECTOMY (N/A)  Patient location during evaluation: PACU Anesthesia Type: General Level of consciousness: awake and alert Pain management: pain level controlled Vital Signs Assessment: post-procedure vital signs reviewed and stable Respiratory status: spontaneous breathing, nonlabored ventilation, respiratory function stable and patient connected to nasal cannula oxygen Cardiovascular status: blood pressure returned to baseline and stable Postop Assessment: no signs of nausea or vomiting Anesthetic complications: no    Last Vitals:  Filed Vitals:   01/31/16 1129 01/31/16 1140  BP:  137/79  Pulse:  79  Temp: 37.3 C 36.8 C  Resp:  20    Last Pain:  Filed Vitals:   01/31/16 1151  PainSc: 2                  Martha Clan

## 2016-01-31 NOTE — Discharge Instructions (Signed)
AMBULATORY SURGERY  °DISCHARGE INSTRUCTIONS ° ° °1) The drugs that you were given will stay in your system until tomorrow so for the next 24 hours you should not: ° °A) Drive an automobile °B) Make any legal decisions °C) Drink any alcoholic beverage ° ° °2) You may resume regular meals tomorrow.  Today it is better to start with liquids and gradually work up to solid foods. ° °You may eat anything you prefer, but it is better to start with liquids, then soup and crackers, and gradually work up to solid foods. ° ° °3) Please notify your doctor immediately if you have any unusual bleeding, trouble breathing, redness and pain at the surgery site, drainage, fever, or pain not relieved by medication. ° ° ° °4) Additional Instructions: ° ° ° ° ° ° ° °Please contact your physician with any problems or Same Day Surgery at [phone], Monday through Friday 6 am to 4 pm, or [place] [facility] [place] Main number at [phone]. °

## 2016-01-31 NOTE — Interval H&P Note (Signed)
History and Physical Interval Note:  01/31/2016 8:44 AM  Barbara Horn  has presented today for surgery, with the diagnosis of GALLSTONES  The various methods of treatment have been discussed with the patient and family. After consideration of risks, benefits and other options for treatment, the patient has consented to  Procedure(s): LAPAROSCOPIC CHOLECYSTECTOMY (N/A) as a surgical intervention .  The patient's history has been reviewed, patient examined, no change in status, stable for surgery.  I have reviewed the patient's chart and labs.  Questions were answered to the patient's satisfaction.     [place]

## 2016-01-31 NOTE — Transfer of Care (Signed)
Immediate Anesthesia Transfer of Care Note  Patient: Barbara Horn  Procedure(s) Performed: Procedure(s): LAPAROSCOPIC CHOLECYSTECTOMY (N/A)  Patient Location: PACU  Anesthesia Type:General  Level of Consciousness: awake, alert , oriented and patient cooperative  Airway & Oxygen Therapy: Patient Spontanous Breathing and Patient connected to face mask oxygen  Post-op Assessment: Report given to RN and Post -op Vital signs reviewed and stable  Post vital signs: Reviewed and stable  Last Vitals:  Filed Vitals:   01/31/16 0750 01/31/16 1045  BP: 116/81   Pulse: 60   Temp: 36.6 C 36.7 C  Resp: 18     Last Pain: There were no vitals filed for this visit.       Complications: No apparent anesthesia complications

## 2016-01-31 NOTE — Anesthesia Procedure Notes (Signed)
Procedure Name: Intubation Performed by: Kennon Holter Pre-anesthesia Checklist: Patient identified, Patient being monitored, Timeout performed, Emergency Drugs available and Suction available Patient Re-evaluated:Patient Re-evaluated prior to inductionOxygen Delivery Method: Circle system utilized Preoxygenation: Pre-oxygenation with 100% oxygen Intubation Type: IV induction Ventilation: Mask ventilation without difficulty Laryngoscope Size: Miller and 2 Grade View: Grade I Tube type: Oral Tube size: 7.0 mm Number of attempts: 1 Airway Equipment and Method: Stylet Placement Confirmation: ETT inserted through vocal cords under direct vision,  positive ETCO2 and breath sounds checked- equal and bilateral Secured at: 23.5 cm Tube secured with: Tape Dental Injury: Teeth and Oropharynx as per pre-operative assessment

## 2016-01-31 NOTE — H&P (View-Only) (Signed)
Patient ID: Barbara Horn, female   DOB: 05-24-1981, 35 y.o.   MRN: [id number]  History of Present Illness Barbara Horn is a 35 y.o. female with recent hx of RUQ pain. Moderate and sharp pain, associated with nausea. Worsening w meals. She is super morbidly obese. U/S showed GS, no CBD dilation, nml LFTS, She has good cv performance but does have sleep apnea. No biliary obstruction. Pain now subsided and she has been on a bland diet  Past Medical History Past Medical History  Diagnosis Date  . Breast mass     Left Lump 9 o'clock per physician  . PCOS (polycystic ovarian syndrome)       Past Surgical History  Procedure Laterality Date  . Tonsillectomy      Allergies  Allergen Reactions  . Bee Venom Swelling  . Penicillins Rash and Other (See Comments)    Patient had reaction as a child and does not remember the details.    Current Outpatient Prescriptions  Medication Sig Dispense Refill  . fexofenadine-pseudoephedrine (ALLEGRA-D) 60-120 MG 12 hr tablet Take 1 tablet by mouth 2 (two) times daily.    . fluticasone (FLONASE) 50 MCG/ACT nasal spray Place 2 sprays into both nostrils daily. 16 g 6  . levonorgestrel-ethinyl estradiol (ALTAVERA) 0.15-30 MG-MCG tablet Take 1 tablet by mouth daily.    . metFORMIN (GLUCOPHAGE) 1000 MG tablet Take 1,000 mg by mouth every evening.     . metFORMIN (GLUCOPHAGE-XR) 500 MG 24 hr tablet Take 2 tablets by mouth daily after supper.  11  . ondansetron (ZOFRAN ODT) 4 MG disintegrating tablet Take 1 tablet (4 mg total) by mouth every 8 (eight) hours as needed for nausea or vomiting. (Patient not taking: Reported on 01/16/2016) 20 tablet 0  . oxyCODONE-acetaminophen (ROXICET) 5-325 MG tablet Take 1 tablet by mouth every 6 (six) hours as needed. (Patient not taking: Reported on 01/16/2016) 12 tablet 0  . sucralfate (CARAFATE) 1 g tablet Take 1 tablet (1 g total) by mouth 2 (two) times daily. 20 tablet 0   No current facility-administered medications for this  visit.    Family History Family History  Problem Relation Age of Onset  . Diabetes Mother   . Hypertension Mother   . Diabetes Father   . Hypertension Father      Social History Social History  Substance Use Topics  . Smoking status: Never Smoker   . Smokeless tobacco: Never Used  . Alcohol Use: No      ROS 10 pt ROS performed and is otherwise negative   Physical Exam Blood pressure 133/85, pulse 60, temperature 97.7 F (36.5 C), temperature source Oral, height 5\' 1"  (1.549 m), weight 132.45 kg (292 lb), last menstrual period 01/03/2016.  CONSTITUTIONAL: Morbidly obese in NAD EYES: Pupils equal, round, and reactive to light, Sclera non-icteric. EARS, NOSE, MOUTH AND THROAT: The oropharynx is clear. Oral mucosa is pink and moist. Hearing is intact to voice.  NECK: Trachea is midline, and there is no jugular venous distension. Thyroid is without palpable abnormalities. LYMPH NODES:  Lymph nodes in the neck are not enlarged. RESPIRATORY:  Lungs are clear, and breath sounds are equal bilaterally. Normal respiratory effort without pathologic use of accessory muscles. CARDIOVASCULAR: Heart is regular without murmurs, gallops, or rubs. GI: The abdomen is  soft, nontender, and nondistended. There were no palpable masses. There was no hepatosplenomegaly. There were normal bowel sounds. GU: Deferred MUSCULOSKELETAL:  Normal muscle strength and tone in all four extremities.  SKIN: Skin turgor is normal. There are no pathologic skin lesions.  NEUROLOGIC:  Motor and sensation is grossly normal.  Cranial nerves are grossly intact. PSYCH:  Alert and oriented to person, place and time. Affect is normal.  Data Reviewed I have personally reviewed the patient's imaging and medical records.    Assessment/Plan.   Symptomatic cholelithiasis in a patient with morbidly obesity. Scars with the patient in detail about her disease process. I do want her to lose some weight and I gave her  bariatric liquid diet to having the next 3 days. We'll schedule him tentatively for a laparoscopic cholecystectomy in about 4 weeks and we will make sure that anesthesia will see [place] for preoperatively secondary to a high risk airway. Discussed with the patient in detail about the surgery.The risks, benefits, complications, treatment options, and expected outcomes were discussed with the patient. The possibilities of bleeding, recurrent infection, finding a normal gallbladder, perforation of viscus organs, damage to surrounding structures, bile leak, abscess formation, needing a drain placed, the need for additional procedures, reaction to medication, pulmonary aspiration,  failure to diagnose a condition, the possible need to convert to an open procedure, and creating a complication requiring transfusion or operation were discussed with the patient. The patient and/or family concurred with the proposed plan, giving informed consent.  Extensive counseling provided  [facility] pabon, MD [place] 01/17/2016, 3:07 PM

## 2016-01-31 NOTE — Op Note (Signed)
Laparoscopic Cholecystectomy  Pre-operative Diagnosis: Symptomatic cholelithiasis  Post-operative Diagnosis: Same  Procedure: Laparoscopic cholecystectomy  Surgeon: Caroleen Hamman, MD FACS  Anesthesia: Gen. with endotracheal tube   Findings: Chronic Cholecystitis   Estimated Blood Loss: 10 cc         Drains: None         Specimens: Gallbladder           Complications: none   Procedure Details  The patient was seen again in the Holding Room. The benefits, complications, treatment options, and expected outcomes were discussed with the patient. The risks of bleeding, infection, recurrence of symptoms, failure to resolve symptoms, bile duct damage, bile duct leak, retained common bile duct stone, bowel injury, any of which could require further surgery and/or ERCP, stent, or papillotomy were reviewed with the patient. The likelihood of improving the patient's symptoms with return to their baseline status is good.  The patient and/or family concurred with the proposed plan, giving informed consent.  The patient was taken to Operating Room, identified as Barbara Horn and the procedure verified as Laparoscopic Cholecystectomy.  A Time Out was held and the above information confirmed.  Prior to the induction of general anesthesia, antibiotic prophylaxis was administered. VTE prophylaxis was in place. General endotracheal anesthesia was then administered and tolerated well. After the induction, the abdomen was prepped with Chloraprep and draped in the sterile fashion. The patient was positioned in the supine position.  Local anesthetic  was injected into the skin near the umbilicus and an incision made. Cut down technique was used to enter the abdominal cavity and a Hasson trochar was placed after two vicryl stitches were anchored to the fascia. Pneumoperitoneum was then created with CO2 and tolerated well without any adverse changes in the patient's vital signs.  Three 5-mm ports were placed in  the right upper quadrant all under direct vision. All skin incisions  were infiltrated with a local anesthetic agent before making the incision and placing the trocars.   The patient was positioned  in reverse Trendelenburg, tilted slightly to the patient's left.  The gallbladder was identified, the fundus grasped and retracted cephalad. Adhesions were lysed bluntly. The infundibulum was grasped and retracted laterally, exposing the peritoneum overlying the triangle of Calot. This was then divided and exposed in a blunt fashion. An extended critical view of the cystic duct and cystic artery was obtained.  The cystic duct was clearly identified and bluntly dissected.   Artery and duct were double clipped and divided.  The gallbladder was taken from the gallbladder fossa in a retrograde fashion with the electrocautery. The gallbladder was removed and placed in an Endocatch bag. The liver bed was irrigated and inspected. Hemostasis was achieved with the electrocautery. Copious irrigation was utilized and was repeatedly aspirated until clear.  The gallbladder and Endocatch sac were then removed through the epigastric port site.   Inspection of the right upper quadrant was performed. No bleeding, bile duct injury or leak, or bowel injury was noted. Pneumoperitoneum was released.  The periumbilical port site was closed with figure-of-eight 0 Vicryl sutures. 4-0 subcuticular Monocryl was used to close the skin. Dermabond was  applied.  The patient was then extubated and brought to the recovery room in stable condition. Sponge, lap, and needle counts were correct at closure and at the conclusion of the case.               Caroleen Hamman, MD, FACS

## 2016-02-01 LAB — SURGICAL PATHOLOGY

## 2016-02-09 ENCOUNTER — Encounter: Payer: Self-pay | Admitting: Physician Assistant

## 2016-02-09 ENCOUNTER — Ambulatory Visit: Payer: Self-pay | Admitting: Family

## 2016-02-09 VITALS — BP 122/84 | HR 72 | Temp 98.1°F

## 2016-02-09 DIAGNOSIS — H6523 Chronic serous otitis media, bilateral: Secondary | ICD-10-CM

## 2016-02-09 DIAGNOSIS — J3089 Other allergic rhinitis: Secondary | ICD-10-CM

## 2016-02-09 MED ORDER — LEVOCETIRIZINE DIHYDROCHLORIDE 5 MG PO TABS: 5.0000 mg | ORAL_TABLET | Freq: Every evening | ORAL | Status: DC

## 2016-02-09 MED ORDER — PREDNISONE 10 MG PO TABS: 30.0000 mg | ORAL_TABLET | Freq: Every day | ORAL | Status: DC

## 2016-02-09 MED ORDER — MONTELUKAST SODIUM 10 MG PO TABS: 10.0000 mg | ORAL_TABLET | Freq: Every day | ORAL | Status: DC

## 2016-02-09 NOTE — Progress Notes (Signed)
S/ year round allergies ragweed, grass, dust , mold ; using flonase daily, otc antihistamines not worked in past  Both ears stopped up and painful, no fever, increased pnd not colored denies acute sxs  O/ VSS Ent tms retracted and dull bilat, nasal turbinates allergic, neg facial tenderness throat clear Neck supple heart rsr lungs clear A/ perennial allergies, serous otitis  P  .  allergy tips reviewed and encouraged rx singulair and xyzal and pred pulse  . F/u prn  Or 6 months.

## 2016-02-14 ENCOUNTER — Encounter: Payer: Self-pay | Admitting: Surgery

## 2016-02-14 ENCOUNTER — Ambulatory Visit (INDEPENDENT_AMBULATORY_CARE_PROVIDER_SITE_OTHER): Payer: Managed Care, Other (non HMO) | Admitting: Surgery

## 2016-02-14 VITALS — BP 125/86 | HR 84 | Temp 98.5°F | Ht 61.0 in | Wt 290.0 lb

## 2016-02-14 DIAGNOSIS — K8020 Calculus of gallbladder without cholecystitis without obstruction: Secondary | ICD-10-CM

## 2016-02-14 NOTE — Progress Notes (Signed)
35yr old female s/p Lap chole for chronic cholecystitis.  Patient doing well today.  She states eating and drinking well. She is having good BMs.  She is no longer taking pain medication but did have some irritation at the epigastric port site with a stitch.   Filed Vitals:   02/14/16 1359  BP: 125/86  Pulse: 84  Temp: 98.5 F (36.9 C)   PE:  Gen: NAD Abd: soft, nt, epigastric site with small stitch at skin unable to remove today, otherwise incisions c/d/i no erythema  A/P:  Healing well after lap chole. Pathology discussed with patient.  Instructed that stitch will dissolve in a few more weeks, can place bandage if causing irritation. May f/u prn.

## 2016-02-14 NOTE — Patient Instructions (Signed)
Please call our office if you have questions or concerns.   

## 2016-02-21 ENCOUNTER — Encounter: Payer: Self-pay | Admitting: General Surgery

## 2016-02-21 ENCOUNTER — Telehealth: Payer: Self-pay | Admitting: Surgery

## 2016-02-21 ENCOUNTER — Ambulatory Visit (INDEPENDENT_AMBULATORY_CARE_PROVIDER_SITE_OTHER): Payer: Managed Care, Other (non HMO) | Admitting: General Surgery

## 2016-02-21 VITALS — BP 139/82 | HR 64 | Temp 98.1°F | Ht 61.0 in | Wt 293.8 lb

## 2016-02-21 DIAGNOSIS — Z4889 Encounter for other specified surgical aftercare: Secondary | ICD-10-CM

## 2016-02-21 NOTE — Telephone Encounter (Signed)
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

## 2016-02-21 NOTE — Progress Notes (Signed)
Outpatient Surgical Follow Up  02/21/2016  Barbara Horn is an 35 y.o. female.   Chief Complaint  Patient presents with  . Routine Post Op    Lapaoscopy Cholecystitis    HPI: 35 year old female returns to clinic today for evaluation of new drainage from her umbilical incision site. Patient denies any fevers, chills, nausea, vomiting, chest pain, shortness breath, diarrhea, agitation. She does state that she's having some discomfort at the site and it appeared to be draining pus this morning. She is here today for evaluation of that area.  Past Medical History  Diagnosis Date  . PCOS (polycystic ovarian syndrome)   . PCOS (polycystic ovarian syndrome)   . Sleep apnea     cpcp  . Seasonal allergies     Past Surgical History  Procedure Laterality Date  . Tonsillectomy    . Foot surgery      x2  . Cholecystectomy N/A 01/31/2016    Procedure: LAPAROSCOPIC CHOLECYSTECTOMY;  Surgeon: Jules Husbands, MD;  Location: [facility] ORS;  Service: General;  Laterality: N/A;    Family History  Problem Relation Age of Onset  . Diabetes Mother   . Hypertension Mother   . Diabetes Father   . Hypertension Father     Social History:  reports that she has never smoked. She has never used smokeless tobacco. She reports that she does not drink alcohol or use illicit drugs.  Allergies:  Allergies  Allergen Reactions  . Sulfa Antibiotics Hives    Hives,lips swelled,nausea,headache,fever  . Bee Venom Swelling  . Penicillins Rash and Other (See Comments)    Patient had reaction as a child and does not remember the details.    Medications reviewed.    ROS A multipoint review of systems was completed. All pertinent positives and negatives are documented in the history of present illness and the remainder negative.   BP 139/82 mmHg  Pulse 64  Temp(Src) 98.1 F (36.7 C) (Oral)  Ht 5\' 1"  (1.549 m)  Wt 133.267 kg (293 lb 12.8 oz)  BMI 55.54 kg/m2  LMP 01/03/2016 (Exact Date)  Physical  Exam Gen.: No acute distress Chest: Clear to sedation Heart: Regular rhythm Abdomen: Large, soft, nontender. Well approximated upper laparoscopic incisions. Periumbilical incision with hyperemia and some serosanguineous drainage. No evidence of purulence on exam.    No results found for this or any previous visit (from the past 48 hour(s)). No results found.  Assessment/Plan:  1. Aftercare following surgery 35 year old female with hyperemia around her umbilical incision site status post lap scopic cholecystectomy. No evidence of obvious infection however there is hyperemia. Discussed starting the use of a topical antibiotic such as bacitracin to the area of hyperemia and keeping the area covered until the drainage stops. Patient voiced understanding. Discussed signs and symptoms of worsening infection and to let clinic immediately should they occur. She'll follow up on an as-needed basis.     Clayburn Pert, MD FACS General Surgeon  02/21/2016,11:13 AM

## 2016-02-21 NOTE — Telephone Encounter (Signed)
Returned phone call to patient. Patient states that she feels she thinks that her incision at her umbilicus is infected. She denies fever. Is having thick yellow drainage and red. Denies warmth.  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 Other incisions are completely back to normal.   Patient placed on schedule this am.

## 2016-02-21 NOTE — Telephone Encounter (Signed)
Patient had LAPAROSCOPIC CHOLECYSTECTOMY with Dr Dahlia Byes on May 10th 2017. Her belly button is now red and has puss coming out of it. She has had MRSA twice in the past few months so she is very worried. Please call and advise.

## 2016-02-21 NOTE — Patient Instructions (Signed)
Please apply Triple Antibiotic ointment to area and cover with bandage until healed. Please call our office if you have any questions or concerns.

## 2016-03-08 ENCOUNTER — Encounter: Payer: Self-pay | Admitting: Physician Assistant

## 2016-03-08 ENCOUNTER — Ambulatory Visit: Payer: Self-pay | Admitting: Physician Assistant

## 2016-03-08 VITALS — BP 120/80 | HR 71 | Temp 98.7°F

## 2016-03-08 DIAGNOSIS — J018 Other acute sinusitis: Secondary | ICD-10-CM

## 2016-03-08 MED ORDER — CLARITHROMYCIN ER 500 MG PO TB24: 1000.0000 mg | ORAL_TABLET | Freq: Every day | ORAL | Status: DC

## 2016-03-08 MED ORDER — FLUCONAZOLE 150 MG PO TABS: 150.0000 mg | ORAL_TABLET | Freq: Once | ORAL | Status: DC

## 2016-03-08 NOTE — Progress Notes (Signed)
S: C/o runny nose and congestion with sore throat for 3 days, no fever, chills, cp/sob, v/d; mucus is bloody and thick, cough is sporadic, c/o of facial and dental pain.   Using otc meds:   O: PE: vitals wnl, nad, perrl eomi, normocephalic, tms dull, nasal mucosa red and swollen, throat injected, neck supple no lymph, lungs c t a, cv rrr, neuro intact  A:  Acute sinusitis   P: biaxin xl 500mg  2 po qd, diflucan if needed, drink fluids, continue regular meds , use otc meds of choice, return if not improving in 5 days, return earlier if worsening

## 2016-03-20 ENCOUNTER — Ambulatory Visit: Payer: Self-pay | Admitting: Physician Assistant

## 2016-03-20 ENCOUNTER — Encounter: Payer: Self-pay | Admitting: Physician Assistant

## 2016-03-20 VITALS — BP 130/79 | HR 76 | Temp 98.5°F

## 2016-03-20 DIAGNOSIS — H6982 Other specified disorders of Eustachian tube, left ear: Secondary | ICD-10-CM

## 2016-03-20 MED ORDER — PREDNISONE 10 MG PO TABS: 30.0000 mg | ORAL_TABLET | Freq: Every day | ORAL | Status: DC

## 2016-03-20 NOTE — Progress Notes (Signed)
S:  C/o left ear hurting and being stopped up, no drainage from ears, can't hear as well from left side, no fever/chills, no cough or congestion, some sinus pressure, remainder ros neg, just finished biaxin xl about 2-3 d ago Using otc meds without relief  O:  Vitals wnl, nad, tms dull b/l, nasal mucosa swollen, throat wnl, neck supple no lymph, lungs c t a, cv rrr, neuro intact  A: acute eustachean tube dysfunction  P: flonase, sudafed, prednisone 30mg  qd x 3d, return if not improving in 3 to 5 days, return earlier if worsening, pt to make appointment with ENT, has had 4 sinus infections since January

## 2016-07-01 ENCOUNTER — Ambulatory Visit: Payer: Self-pay | Admitting: Physician Assistant

## 2016-07-01 ENCOUNTER — Encounter: Payer: Self-pay | Admitting: Physician Assistant

## 2016-07-01 VITALS — BP 110/79 | HR 83 | Temp 98.9°F

## 2016-07-01 DIAGNOSIS — J069 Acute upper respiratory infection, unspecified: Secondary | ICD-10-CM

## 2016-07-01 MED ORDER — METHYLPREDNISOLONE 4 MG PO TBPK: ORAL_TABLET | ORAL | 0 refills | Status: DC

## 2016-07-01 MED ORDER — AZITHROMYCIN 250 MG PO TABS: ORAL_TABLET | ORAL | 0 refills | Status: DC

## 2016-07-01 MED ORDER — FLUCONAZOLE 150 MG PO TABS: 150.0000 mg | ORAL_TABLET | Freq: Once | ORAL | 0 refills | Status: AC

## 2016-07-01 NOTE — Progress Notes (Signed)
S: C/o runny nose and congestion with dry cough and wheezing for 3 days, no fever, chills, cp/sob, v/d; mucus was green this am but clear throughout the day, cough is sporadic,   Using otc meds: robitussin  O: PE: vitals wnl, nad, perrl eomi, normocephalic, tms dull, nasal mucosa red and swollen, throat injected, neck supple no lymph, + laryngeal wheezing, lungs c t a, cv rrr, neuro intact  A:  Acute uri   P: drink fluids, continue regular meds , use otc meds of choice, return if not improving in 5 days, return earlier if worsening , medrol dose pack, zpack, diflucan if needed

## 2016-07-02 ENCOUNTER — Ambulatory Visit: Payer: Self-pay | Admitting: Physician Assistant

## 2016-07-08 ENCOUNTER — Emergency Department
Admission: EM | Admit: 2016-07-08 | Discharge: 2016-07-08 | Disposition: A | Discharge status: Home / Self Care | Payer: Managed Care, Other (non HMO) | Attending: Emergency Medicine | Admitting: Emergency Medicine

## 2016-07-08 ENCOUNTER — Encounter: Payer: Self-pay | Admitting: Emergency Medicine

## 2016-07-08 DIAGNOSIS — S61219A Laceration without foreign body of unspecified finger without damage to nail, initial encounter: Secondary | ICD-10-CM

## 2016-07-08 DIAGNOSIS — Z79899 Other long term (current) drug therapy: Secondary | ICD-10-CM | POA: Insufficient documentation

## 2016-07-08 DIAGNOSIS — Y929 Unspecified place or not applicable: Secondary | ICD-10-CM | POA: Diagnosis not present

## 2016-07-08 DIAGNOSIS — S61215A Laceration without foreign body of left ring finger without damage to nail, initial encounter: Secondary | ICD-10-CM | POA: Insufficient documentation

## 2016-07-08 DIAGNOSIS — Z23 Encounter for immunization: Secondary | ICD-10-CM | POA: Diagnosis not present

## 2016-07-08 DIAGNOSIS — Z7984 Long term (current) use of oral hypoglycemic drugs: Secondary | ICD-10-CM | POA: Insufficient documentation

## 2016-07-08 DIAGNOSIS — Y990 Civilian activity done for income or pay: Secondary | ICD-10-CM | POA: Insufficient documentation

## 2016-07-08 DIAGNOSIS — Z7951 Long term (current) use of inhaled steroids: Secondary | ICD-10-CM | POA: Diagnosis not present

## 2016-07-08 DIAGNOSIS — W268XXA Contact with other sharp object(s), not elsewhere classified, initial encounter: Secondary | ICD-10-CM | POA: Diagnosis not present

## 2016-07-08 DIAGNOSIS — Y9389 Activity, other specified: Secondary | ICD-10-CM | POA: Diagnosis not present

## 2016-07-08 MED ORDER — LIDOCAINE HCL (PF) 1 % IJ SOLN
INTRAMUSCULAR | Status: AC
  Filled 2016-07-08: qty 5

## 2016-07-08 MED ORDER — TETANUS-DIPHTH-ACELL PERTUSSIS 5-2.5-18.5 LF-MCG/0.5 IM SUSP
0.5000 mL | Freq: Once | INTRAMUSCULAR | Status: AC
  Administered 2016-07-08: 0.5 mL via INTRAMUSCULAR
  Filled 2016-07-08: qty 0.5

## 2016-07-08 MED ORDER — OXYCODONE-ACETAMINOPHEN 5-325 MG PO TABS: 1.0000 | ORAL_TABLET | Freq: Four times a day (QID) | ORAL | 0 refills | Status: DC | PRN

## 2016-07-08 NOTE — ED Provider Notes (Signed)
[facility] Emergency Department Provider Note   ____________________________________________   None    (approximate)  I have reviewed the triage vital signs and the nursing notes.   HISTORY  Chief Complaint Extremity Laceration    HPI Barbara Horn is a 35 y.o. female patient present in the with a laceration to the distal fourth digit left hand. Laceration secondary to metal cut while trying over-the-counter and. Hemorrhage and control with direct pressure. Patient denies loss of sensation or loss of function of the finger. Patient state her tetanus shot is not up-to-date.Patient rates the pain as a 5/10. Patient described a pain as "burning". Except for direct pressure to control bleeding no other palliative measures taken prior to arrival.   Past Medical History:  Diagnosis Date  . PCOS (polycystic ovarian syndrome)   . PCOS (polycystic ovarian syndrome)   . Seasonal allergies   . Sleep apnea    cpcp    Patient Active Problem List   Diagnosis Date Noted  . Cyst of ovary 09/20/2015  . Ovarian mass, right 07/05/2015  . Noninflammatory disorder of ovary, fallopian tube, and broad ligament 07/05/2015  . History of PCOS 06/28/2015  . Morbid obesity ([place]) 06/28/2015  . H/O female genital system disorder 06/28/2015    Past Surgical History:  Procedure Laterality Date  . CHOLECYSTECTOMY N/A 01/31/2016   Procedure: LAPAROSCOPIC CHOLECYSTECTOMY;  Surgeon: Jules Husbands, MD;  Location: [facility] ORS;  Service: General;  Laterality: N/A;  . FOOT SURGERY     x2  . TONSILLECTOMY      Prior to Admission medications   Medication Sig Start Date End Date Taking? Authorizing Provider  azithromycin (ZITHROMAX Z-PAK) 250 MG tablet 2 pills today then 1 pill a day for 4 days 07/01/16   Versie Starks, PA-C  EPINEPHrine (EPIPEN IJ) Inject as directed as needed.    Historical Provider, MD  fluticasone (FLONASE) 50 MCG/ACT nasal spray Place 2 sprays into both  nostrils daily. 12/01/15   Versie Starks, PA-C  levocetirizine (XYZAL) 5 MG tablet Take 1 tablet (5 mg total) by mouth every evening. 02/09/16   Tommie Homero Fellers, NP  levonorgestrel-ethinyl estradiol (ALTAVERA) 0.15-30 MG-MCG tablet Take 1 tablet by mouth daily.    Historical Provider, MD  metFORMIN (GLUCOPHAGE) 1000 MG tablet Take 1,000 mg by mouth every evening.     Historical Provider, MD  methylPREDNISolone (MEDROL DOSEPAK) 4 MG TBPK tablet Take 6 pills on day one then decrease by 1 pill each day 07/01/16   Versie Starks, PA-C  montelukast (SINGULAIR) 10 MG tablet Take 1 tablet (10 mg total) by mouth at bedtime. For allergies 02/09/16   Tommie Homero Fellers, NP  oxyCODONE-acetaminophen (ROXICET) 5-325 MG tablet Take 1 tablet by mouth every 6 (six) hours as needed for moderate pain. 07/08/16   Sable Feil, PA-C    Allergies Sulfa antibiotics; Bee venom; and Penicillins  Family History  Problem Relation Age of Onset  . Diabetes Mother   . Hypertension Mother   . Diabetes Father   . Hypertension Father     Social History Social History  Substance Use Topics  . Smoking status: Never Smoker  . Smokeless tobacco: Never Used  . Alcohol use No    Review of Systems Constitutional: No fever/chills Eyes: No visual changes. ENT: No sore throat. Cardiovascular: Denies chest pain. Respiratory: Denies shortness of breath. Gastrointestinal: No abdominal pain.  No nausea, no vomiting.  No diarrhea.  No constipation. Genitourinary: Negative  for dysuria. Musculoskeletal: Negative for back pain. Skin: Negative for rash. Neurological: Negative for headaches, focal weakness or numbness. c: Allergic/Immunilogical: See medication list  ____________________________________________   PHYSICAL EXAM:  VITAL SIGNS: ED Triage Vitals  Enc Vitals Group     BP 07/08/16 1101 139/81     Pulse Rate 07/08/16 1101 86     Resp 07/08/16 1101 20     Temp 07/08/16 1101 97.6 F (36.4 C)     Temp  Source 07/08/16 1101 Oral     SpO2 07/08/16 1101 100 %     Weight 07/08/16 1058 (!) 302 lb (137 kg)     Height 07/08/16 1058 5' (1.524 m)     Head Circumference --      Peak Flow --      Pain Score 07/08/16 1058 5     Pain Loc --      Pain Edu? --      Excl. in [place]? --     Constitutional: Alert and oriented. Well appearing and in no acute distress. Eyes: Conjunctivae are normal. PERRL. EOMI. Head: Atraumatic. Nose: No congestion/rhinnorhea. Mouth/Throat: Mucous membranes are moist.  Oropharynx non-erythematous. Neck: No stridor.  No cervical spine tenderness to palpation. Hematological/Lymphatic/Immunilogical: No cervical lymphadenopathy. Cardiovascular: Normal rate, regular rhythm. Grossly normal heart sounds.  Good peripheral circulation. Respiratory: Normal respiratory effort.  No retractions. Lungs CTAB. Gastrointestinal: Soft and nontender. No distention. No abdominal bruits. No CVA tenderness. Musculoskeletal: No lower extremity tenderness nor edema.  No joint effusions. Neurologic:  Normal speech and language. No gross focal neurologic deficits are appreciated. No gait instability. Skin:  Skin is warm, dry and intact. No rash noted.Laceration distal fourth digit left hand. Psychiatric: Mood and affect are normal. Speech and behavior are normal.  ____________________________________________   LABS (all labs ordered are listed, but only abnormal results are displayed)  Labs Reviewed - No data to display ____________________________________________  EKG   ____________________________________________  RADIOLOGY   ____________________________________________   PROCEDURES  Procedure(s) performed: LACERATION REPAIR Performed by: Sable Feil Authorized by: Sable Feil Consent: Verbal consent obtained. Risks and benefits: risks, benefits and alternatives were discussed Consent given by: patient Patient identity confirmed: provided demographic data Prepped and  Draped in normal sterile fashion Wound explored  Laceration Location: Distal fourth digit left hand.  Laceration Length:1cm  No Foreign Bodies seen or palpated  Anesthesia: Digital block   Local anesthetic: lidocaine 1% without epinephrine  Anesthetic total: 2 ml  Irrigation method: syringe Amount of cleaning: standard  Skin closure: 4 nylon Number of sutures:4 Technique: Interrupted  Patient tolerance: Patient tolerated the procedure well with no immediate complications.   Procedures  Critical Care performed: No  ____________________________________________   INITIAL IMPRESSION / ASSESSMENT AND PLAN / ED COURSE  Pertinent labs & imaging results that were available during my care of the patient were reviewed by me and considered in my medical decision making (see chart for details).  Laceration distal fourth digit left hand. Patient given discharge care instructions. Patient given a prescription for Percocets. Patient advised return right ear ED in 10 days for suture removal. Patient also masses removal by family doctor urgent care clinic.  Clinical Course     ____________________________________________   FINAL CLINICAL IMPRESSION(S) / ED DIAGNOSES  Final diagnoses:  Finger laceration, initial encounter      NEW MEDICATIONS STARTED DURING THIS VISIT:  New Prescriptions   OXYCODONE-ACETAMINOPHEN (ROXICET) 5-325 MG TABLET    Take 1 tablet by mouth every 6 (six)  hours as needed for moderate pain.     Note:  This document was prepared using Dragon voice recognition software and may include unintentional dictation errors.    Sable Feil, PA-C 07/08/16 [place], MD 07/08/16 [phone]

## 2016-07-08 NOTE — ED Triage Notes (Signed)
Pt presents with laceration to left ring finger. She was opening a can and cut it at work. Pt does not want to file WC at this time. Pt alert & oriented with NAD noted.

## 2016-07-08 NOTE — ED Notes (Signed)
See triage note  States she was opening a can  Slipped and laceration noted to tip of left ring finger

## 2016-07-29 NOTE — Patient Instructions (Signed)
  Your procedure is scheduled on: 08-05-16 [facility]) Report to Same Day Surgery 2nd floor medical mall To find out your arrival time please call [phone] between [place] on 08-02-16 (FRIDAY)  Remember: Instructions that are not followed completely may result in serious medical risk, up to and including death, or upon the discretion of your surgeon and anesthesiologist your surgery may need to be rescheduled.    _x___ 1. Do not eat food or drink liquids after midnight. No gum chewing or hard candies.     __x__ 2. No Alcohol for 24 hours before or after surgery.   __x__3. No Smoking for 24 prior to surgery.   ____  4. Bring all medications with you on the day of surgery if instructed.    __x__ 5. Notify your doctor if there is any change in your medical condition     (cold, fever, infections).     Do not wear jewelry, make-up, hairpins, clips or nail polish.  Do not wear lotions, powders, or perfumes. You may wear deodorant.  Do not shave 48 hours prior to surgery. Men may shave face and neck.  Do not bring valuables to the hospital.    [facility] is not responsible for any belongings or valuables.               Contacts, dentures or bridgework may not be worn into surgery.  Leave your suitcase in the car. After surgery it may be brought to your room.  For patients admitted to the hospital, discharge time is determined by your treatment team.   Patients discharged the day of surgery will not be allowed to drive home.    Please read over the following fact sheets that you were given:   [facility] Preparing for Surgery and or MRSA Information   ____ Take these medicines the morning of surgery with A SIP OF WATER:    1. NONE  2.  3.  4.  5.  6.  ____Fleets enema or Magnesium Citrate as directed.   ____ Use CHG Soap or sage wipes as directed on instruction sheet   ____ Use inhalers on the day of surgery and bring to hospital day of surgery  __X__ Stop metformin 2  days prior to surgery-LAST DOSE ON Friday, 08-02-16    ____ Take 1/2 of usual insulin dose the night before surgery and none on the morning of  surgery.   ____ Stop aspirin or coumadin, or plavix  x__ Stop Anti-inflammatories such as Advil, Aleve, Ibuprofen, Motrin, Naproxen,          Naprosyn, Goodies powders or aspirin products. Ok to take Tylenol.   ____ Stop supplements until after surgery.    ____ Bring C-Pap to the hospital.

## 2016-07-30 ENCOUNTER — Encounter
Admission: RE | Admit: 2016-07-30 | Discharge: 2016-07-30 | Disposition: A | Discharge status: Home / Self Care | Payer: Managed Care, Other (non HMO) | Source: Ambulatory Visit | Attending: Otolaryngology | Admitting: Otolaryngology

## 2016-07-30 DIAGNOSIS — Z01812 Encounter for preprocedural laboratory examination: Secondary | ICD-10-CM | POA: Insufficient documentation

## 2016-07-30 HISTORY — DX: Other complications of anesthesia, initial encounter: T88.59XA

## 2016-07-30 HISTORY — DX: Adverse effect of unspecified anesthetic, initial encounter: T41.45XA

## 2016-07-30 LAB — SURGICAL PCR SCREEN
MRSA, PCR: NEGATIVE
STAPHYLOCOCCUS AUREUS: NEGATIVE

## 2016-08-05 ENCOUNTER — Encounter: Payer: Self-pay | Admitting: Anesthesiology

## 2016-08-05 ENCOUNTER — Ambulatory Visit
Admission: RE | Admit: 2016-08-05 | Discharge: 2016-08-05 | Disposition: A | Discharge status: Home / Self Care | Payer: Managed Care, Other (non HMO) | Source: Ambulatory Visit | Attending: Otolaryngology | Admitting: Otolaryngology

## 2016-08-05 ENCOUNTER — Ambulatory Visit: Payer: Managed Care, Other (non HMO) | Admitting: Anesthesiology

## 2016-08-05 ENCOUNTER — Encounter
Admission: RE | Disposition: A | Discharge status: Home / Self Care | Payer: Self-pay | Source: Ambulatory Visit | Attending: Otolaryngology

## 2016-08-05 DIAGNOSIS — Z7951 Long term (current) use of inhaled steroids: Secondary | ICD-10-CM | POA: Insufficient documentation

## 2016-08-05 DIAGNOSIS — J323 Chronic sphenoidal sinusitis: Secondary | ICD-10-CM | POA: Diagnosis not present

## 2016-08-05 DIAGNOSIS — Z8249 Family history of ischemic heart disease and other diseases of the circulatory system: Secondary | ICD-10-CM | POA: Diagnosis not present

## 2016-08-05 DIAGNOSIS — J342 Deviated nasal septum: Secondary | ICD-10-CM | POA: Insufficient documentation

## 2016-08-05 DIAGNOSIS — Z882 Allergy status to sulfonamides status: Secondary | ICD-10-CM | POA: Insufficient documentation

## 2016-08-05 DIAGNOSIS — J343 Hypertrophy of nasal turbinates: Secondary | ICD-10-CM | POA: Diagnosis not present

## 2016-08-05 DIAGNOSIS — E669 Obesity, unspecified: Secondary | ICD-10-CM | POA: Diagnosis not present

## 2016-08-05 DIAGNOSIS — J320 Chronic maxillary sinusitis: Secondary | ICD-10-CM | POA: Insufficient documentation

## 2016-08-05 DIAGNOSIS — Z79899 Other long term (current) drug therapy: Secondary | ICD-10-CM | POA: Insufficient documentation

## 2016-08-05 DIAGNOSIS — G4730 Sleep apnea, unspecified: Secondary | ICD-10-CM | POA: Insufficient documentation

## 2016-08-05 DIAGNOSIS — Z880 Allergy status to penicillin: Secondary | ICD-10-CM | POA: Diagnosis not present

## 2016-08-05 HISTORY — PX: TURBINATE REDUCTION: SHX6157

## 2016-08-05 HISTORY — PX: MAXILLARY ANTROSTOMY: SHX2003

## 2016-08-05 HISTORY — PX: IMAGE GUIDED SINUS SURGERY: SHX6570

## 2016-08-05 HISTORY — PX: ENDOSCOPIC CONCHA BULLOSA RESECTION: SHX6395

## 2016-08-05 HISTORY — PX: SEPTOPLASTY: SHX2393

## 2016-08-05 HISTORY — PX: SPHENOIDECTOMY: SHX2421

## 2016-08-05 LAB — POCT PREGNANCY, URINE: PREG TEST UR: NEGATIVE

## 2016-08-05 SURGERY — SEPTOPLASTY, NOSE
Anesthesia: General | Laterality: Right

## 2016-08-05 MED ORDER — CEFAZOLIN SODIUM-DEXTROSE 2-4 GM/100ML-% IV SOLN
2.0000 g | Freq: Once | INTRAVENOUS | Status: AC
  Administered 2016-08-05: 2 g via INTRAVENOUS

## 2016-08-05 MED ORDER — LIDOCAINE HCL (CARDIAC) 20 MG/ML IV SOLN
INTRAVENOUS | Status: DC | PRN
  Administered 2016-08-05: 60 mg via INTRAVENOUS

## 2016-08-05 MED ORDER — FAMOTIDINE 20 MG PO TABS
20.0000 mg | ORAL_TABLET | Freq: Once | ORAL | Status: AC
  Administered 2016-08-05: 20 mg via ORAL

## 2016-08-05 MED ORDER — GLYCOPYRROLATE 0.2 MG/ML IJ SOLN
INTRAMUSCULAR | Status: DC | PRN
  Administered 2016-08-05: 1 mg via INTRAVENOUS
  Administered 2016-08-05: 0.2 mg via INTRAVENOUS

## 2016-08-05 MED ORDER — DEXAMETHASONE SODIUM PHOSPHATE 10 MG/ML IJ SOLN
INTRAMUSCULAR | Status: DC | PRN
  Administered 2016-08-05: 4 mg via INTRAVENOUS

## 2016-08-05 MED ORDER — PHENYLEPHRINE HCL 10 MG/ML IJ SOLN
INTRAMUSCULAR | Status: AC
  Filled 2016-08-05: qty 1

## 2016-08-05 MED ORDER — MIDAZOLAM HCL 2 MG/2ML IJ SOLN
INTRAMUSCULAR | Status: DC | PRN
  Administered 2016-08-05: 2 mg via INTRAVENOUS

## 2016-08-05 MED ORDER — ONDANSETRON HCL 4 MG/2ML IJ SOLN
INTRAMUSCULAR | Status: DC | PRN
  Administered 2016-08-05: 4 mg via INTRAVENOUS

## 2016-08-05 MED ORDER — OXYMETAZOLINE HCL 0.05 % NA SOLN
NASAL | Status: AC
  Administered 2016-08-05: 2 via NASAL
  Filled 2016-08-05: qty 15

## 2016-08-05 MED ORDER — PHENYLEPHRINE HCL 10 % OP SOLN
Freq: Once | OPHTHALMIC | Status: AC
  Administered 2016-08-05: 10 mL via TOPICAL
  Filled 2016-08-05: qty 10

## 2016-08-05 MED ORDER — LIDOCAINE-EPINEPHRINE (PF) 1 %-1:200000 IJ SOLN
INTRAMUSCULAR | Status: AC
  Filled 2016-08-05: qty 30

## 2016-08-05 MED ORDER — LIDOCAINE HCL (PF) 4 % IJ SOLN
INTRAMUSCULAR | Status: AC
  Filled 2016-08-05: qty 10

## 2016-08-05 MED ORDER — ACETAMINOPHEN 10 MG/ML IV SOLN
INTRAVENOUS | Status: AC
  Filled 2016-08-05: qty 100

## 2016-08-05 MED ORDER — ONDANSETRON HCL 4 MG/2ML IJ SOLN: 4.0000 mg | Freq: Once | INTRAMUSCULAR | Status: DC | PRN

## 2016-08-05 MED ORDER — OXYMETAZOLINE HCL 0.05 % NA SOLN
2.0000 | Freq: Once | NASAL | Status: AC
  Administered 2016-08-05: 2 via NASAL

## 2016-08-05 MED ORDER — PROPOFOL 10 MG/ML IV BOLUS
INTRAVENOUS | Status: DC | PRN
  Administered 2016-08-05: 200 mg via INTRAVENOUS

## 2016-08-05 MED ORDER — FENTANYL CITRATE (PF) 100 MCG/2ML IJ SOLN
25.0000 ug | INTRAMUSCULAR | Status: DC | PRN
  Administered 2016-08-05: 25 ug via INTRAVENOUS

## 2016-08-05 MED ORDER — LACTATED RINGERS IV SOLN
INTRAVENOUS | Status: DC
  Administered 2016-08-05 (×2): via INTRAVENOUS

## 2016-08-05 MED ORDER — SUCCINYLCHOLINE CHLORIDE 20 MG/ML IJ SOLN
INTRAMUSCULAR | Status: DC | PRN
  Administered 2016-08-05: 140 mg via INTRAVENOUS

## 2016-08-05 MED ORDER — NEOSTIGMINE METHYLSULFATE 10 MG/10ML IV SOLN
INTRAVENOUS | Status: DC | PRN
  Administered 2016-08-05: 5 mg via INTRAVENOUS

## 2016-08-05 MED ORDER — CEFAZOLIN SODIUM-DEXTROSE 2-4 GM/100ML-% IV SOLN
INTRAVENOUS | Status: AC
  Administered 2016-08-05: 2 g via INTRAVENOUS
  Filled 2016-08-05: qty 100

## 2016-08-05 MED ORDER — ACETAMINOPHEN 10 MG/ML IV SOLN
INTRAVENOUS | Status: DC | PRN
  Administered 2016-08-05: 1000 mg via INTRAVENOUS

## 2016-08-05 MED ORDER — FENTANYL CITRATE (PF) 100 MCG/2ML IJ SOLN
INTRAMUSCULAR | Status: AC
  Administered 2016-08-05: 25 ug via INTRAVENOUS
  Filled 2016-08-05: qty 2

## 2016-08-05 MED ORDER — LIDOCAINE-EPINEPHRINE (PF) 1 %-1:200000 IJ SOLN
INTRAMUSCULAR | Status: DC | PRN
  Administered 2016-08-05: 6 mL

## 2016-08-05 MED ORDER — ROCURONIUM BROMIDE 100 MG/10ML IV SOLN
INTRAVENOUS | Status: DC | PRN
  Administered 2016-08-05: 10 mg via INTRAVENOUS
  Administered 2016-08-05: 40 mg via INTRAVENOUS
  Administered 2016-08-05: 10 mg via INTRAVENOUS
  Administered 2016-08-05: 20 mg via INTRAVENOUS

## 2016-08-05 MED ORDER — FAMOTIDINE 20 MG PO TABS
ORAL_TABLET | ORAL | Status: AC
  Administered 2016-08-05: 20 mg via ORAL
  Filled 2016-08-05: qty 1

## 2016-08-05 MED ORDER — FENTANYL CITRATE (PF) 100 MCG/2ML IJ SOLN
INTRAMUSCULAR | Status: DC | PRN
  Administered 2016-08-05: 50 ug via INTRAVENOUS
  Administered 2016-08-05: 100 ug via INTRAVENOUS
  Administered 2016-08-05 (×3): 50 ug via INTRAVENOUS

## 2016-08-05 SURGICAL SUPPLY — 40 items
BATTERY INSTRU NAVIGATION (MISCELLANEOUS) ×18 IMPLANT
BLADE SURG 15 STRL LF DISP TIS (BLADE) ×4 IMPLANT
BLADE SURG 15 STRL SS (BLADE) ×2
CANISTER SUCT 1200ML W/VALVE (MISCELLANEOUS) ×6 IMPLANT
CANISTER SUCT 3000ML (MISCELLANEOUS) ×6 IMPLANT
COAG SUCT 10F 3.5MM HAND CTRL (MISCELLANEOUS) ×6 IMPLANT
CUP MEDICINE 2OZ PLAST GRAD ST (MISCELLANEOUS) ×6 IMPLANT
DRESSING NASL FOAM PST OP SINU (MISCELLANEOUS) IMPLANT
DRSG NASAL 4CM NASOPORE (MISCELLANEOUS) IMPLANT
DRSG NASAL FOAM POST OP SINU (MISCELLANEOUS)
ELECT REM PT RETURN 9FT ADLT (ELECTROSURGICAL) ×6
ELECTRODE REM PT RTRN 9FT ADLT (ELECTROSURGICAL) ×4 IMPLANT
GLOVE PROTEXIS LATEX SZ 7.5 (GLOVE) ×12 IMPLANT
GOWN STRL REUS W/ TWL LRG LVL3 (GOWN DISPOSABLE) ×8 IMPLANT
GOWN STRL REUS W/TWL LRG LVL3 (GOWN DISPOSABLE) ×4
IV NS 500ML (IV SOLUTION) ×2
IV NS 500ML BAXH (IV SOLUTION) ×4 IMPLANT
LABEL OR SOLS (LABEL) IMPLANT
NAVIGATION MASK REG  ST (MISCELLANEOUS) ×6 IMPLANT
NEEDLE SPNL 25GX3.5 QUINCKE BL (NEEDLE) ×6 IMPLANT
NS IRRIG 500ML POUR BTL (IV SOLUTION) ×6 IMPLANT
PACK HEAD/NECK (MISCELLANEOUS) ×6 IMPLANT
PACKING NASAL EPIS 4X2.4 XEROG (MISCELLANEOUS) ×6 IMPLANT
PATTIES SURGICAL .5 X3 (DISPOSABLE) ×12 IMPLANT
SHAVER DIEGO BLD STD TYPE A (BLADE) ×6 IMPLANT
SOL ANTI-FOG 6CC FOG-OUT (MISCELLANEOUS) ×4 IMPLANT
SOL FOG-OUT ANTI-FOG 6CC (MISCELLANEOUS) ×2
SPLINT NASAL REUTER (MISCELLANEOUS) ×6 IMPLANT
SPOGE SURGIFLO 8M (HEMOSTASIS)
SPONGE SURGIFLO 8M (HEMOSTASIS) IMPLANT
SUT CHROMIC 3-0 (SUTURE) ×2
SUT CHROMIC 3-0 KS 27XMFL CR (SUTURE) ×4
SUT ETHILON 3-0 KS 30 BLK (SUTURE) ×6 IMPLANT
SUT PLAIN GUT 4-0 (SUTURE) ×6 IMPLANT
SUTURE CHRMC 3-0 KS 27XMFL CR (SUTURE) ×4 IMPLANT
SWAB CULTURE AMIES ANAERIB BLU (MISCELLANEOUS) IMPLANT
SYR 20CC LL (SYRINGE) ×6 IMPLANT
SYR 3ML LL SCALE MARK (SYRINGE) ×6 IMPLANT
TUBING DECLOG MULTIDEBRIDER (TUBING) ×6 IMPLANT
WATER STERILE IRR 1000ML POUR (IV SOLUTION) IMPLANT

## 2016-08-05 NOTE — H&P (Signed)
  H&P has been reviewed and no changes necessary. To be downloaded later. 

## 2016-08-05 NOTE — Anesthesia Postprocedure Evaluation (Signed)
Anesthesia Post Note  Patient: JOIYA POTENZA  Procedure(s) Performed: Procedure(s) (LRB): SEPTOPLASTY (N/A) MAXILLARY ANTROSTOMY WITH TISSUE REMOVAL (Right) SPHENOIDECTOMY (Left) TURBINATE REDUCTION BILATERAL INFERIOR (Bilateral) ENDOSCOPIC CONCHA BULLOSA RESECTION (Bilateral) IMAGE GUIDED SINUS SURGERY (Bilateral)  Patient location during evaluation: PACU Anesthesia Type: General Level of consciousness: awake and alert Pain management: pain level controlled Vital Signs Assessment: post-procedure vital signs reviewed and stable Respiratory status: spontaneous breathing, nonlabored ventilation, respiratory function stable and patient connected to nasal cannula oxygen Cardiovascular status: blood pressure returned to baseline and stable Postop Assessment: no signs of nausea or vomiting Anesthetic complications: no    Last Vitals:  Vitals:   08/05/16 1059 08/05/16 1153  BP: 126/60 (!) 123/54  Pulse: 67 83  Resp: 16 16  Temp: 36.9 C     Last Pain:  Vitals:   08/05/16 1059  TempSrc: Temporal  PainSc: 2                  Patton Swisher S

## 2016-08-05 NOTE — Discharge Instructions (Signed)
AMBULATORY SURGERY  °DISCHARGE INSTRUCTIONS ° ° °1) The drugs that you were given will stay in your system until tomorrow so for the next 24 hours you should not: ° °A) Drive an automobile °B) Make any legal decisions °C) Drink any alcoholic beverage ° ° °2) You may resume regular meals tomorrow.  Today it is better to start with liquids and gradually work up to solid foods. ° °You may eat anything you prefer, but it is better to start with liquids, then soup and crackers, and gradually work up to solid foods. ° ° °3) Please notify your doctor immediately if you have any unusual bleeding, trouble breathing, redness and pain at the surgery site, drainage, fever, or pain not relieved by medication. ° ° ° °4) Additional Instructions: ° ° ° ° ° ° ° °Please contact your physician with any problems or Same Day Surgery at [phone], Monday through Friday 6 am to 4 pm, or [place] at [place] Main number at [phone]. °

## 2016-08-05 NOTE — Transfer of Care (Signed)
Immediate Anesthesia Transfer of Care Note  Patient: Barbara Horn  Procedure(s) Performed: Procedure(s): SEPTOPLASTY (N/A) MAXILLARY ANTROSTOMY WITH TISSUE REMOVAL (Right) SPHENOIDECTOMY (Left) TURBINATE REDUCTION BILATERAL INFERIOR (Bilateral) ENDOSCOPIC CONCHA BULLOSA RESECTION (Bilateral) IMAGE GUIDED SINUS SURGERY (Bilateral)  Patient Location: PACU  Anesthesia Type:General  Level of Consciousness: sedated and responds to stimulation  Airway & Oxygen Therapy: Patient Spontanous Breathing and Patient connected to face mask oxygen  Post-op Assessment: Report given to RN and Post -op Vital signs reviewed and stable  Post vital signs: Reviewed and stable  Last Vitals:  Vitals:   08/05/16 0615 08/05/16 0943  BP: 121/81 (!) 159/94  Pulse:  88  Resp: 14 19  Temp: 36.3 C     Last Pain:  Vitals:   08/05/16 0615  TempSrc: Oral  PainSc: 1          Complications: No apparent anesthesia complications

## 2016-08-05 NOTE — Op Note (Signed)
08/05/2016  9:36 AM  [id number]   Pre-Op Dx: Chronic right maxillary sinusitis, chronic left sphenoid sinusitis, Deviated Nasal Septum, Hypertrophic Inferior Turbinates  Post-op Dx: Same  Proc: Left endoscopic sphenoid sinusotomy, right endoscopic maxillary antrostomy, Nasal Septoplasty, Bilateral Partial Reduction Inferior Turbinates, use of image guided system  Surg:  Andree Golphin H  Anes:  GOT  EBL:  200 mL  Comp:  None  Findings: The patient continued to ooze throughout the procedure it difficult to visualize the areas. The septum was deviated to the left side. There is thickened mucous membranes around the opening to the right maxillary sinus that needed to be removed  Procedure: With the patient in a comfortable supine position,  general orotracheal anesthesia was induced without difficulty.     The patient received preoperative Afrin spray for topical decongestion and vasoconstriction.  Intravenous prophylactic antibiotics were administered.  At an appropriate level, the patient was placed in a semi-sitting position.  Nasal vibrissae were trimmed.   1% Xylocaine with 1:100,000 epinephrine, 7 cc's, was infiltrated into the anterior floor of the nose, into the nasal spine region, into the membranous columella, and finally into the submucoperichondrial plane of the septum on both sides.  Several minutes were allowed for this to take effect.  Cottoniod pledgetts soaked in Afrin and 4% Xylocaine were placed into both nasal cavities and left while the patient was prepped and draped in the standard fashion.  The materials were removed from the nose and observed to be intact and correct in number.  The nose was inspected with a headlight and the 0 scope with the findings as described above. The image guided system was brought in and the CT scan was downloaded from the disc. The template was applied to the face and was registered to the system. The suction instruments were then registered  the system as well. There was 0.7 mm of variance. The suction instruments showed perfect visual alignment with the image guided system.  A left Killian incision was sharply executed and carried down to the quadrangular cartilage. The mucoperichondrium was elelvated along the quadrangular plate back to the bony-cartilaginous junction. The mucoperiostium was then elevated along the ethmoid plate and the vomer. The boney-catilaginous junction was then split with a freer elevator and the mucoperiosteum was elevated on the opposite side. The mucoperiosteum was then elevated along the maxillary crest as needed to expose the crooked bone of the crest.  Boney spurs of the vomer and maxillary crest were removed with Donavan Foil forceps.  The cartilaginous plate was trimmed along its posterior and inferior borders of about 2 mm of cartilage to free it up inferiorly. Some of the deviated ethmoid plate was then fractured and removed with Takahashi forceps to free up the posterior border of the quadrangular plate and allow it to swing back to the midline. The mucosal flaps were placed back into their anatomic position to allow visualization of the airways. The septum now sat in the midline with an improved airway.  A 3-0 Chromic suture on a Keith needle in used to anchor the inferior septum at the nasal spine with a through and through suture. The mucosal flaps are then sutured together using a through and through whip stitch of 4-0 Plain Gut with a mini-Keith needle. This was used to close the Eva incision as well.   The inferior turbinates were then inspected. An incision was created along the inferior aspect of the left inferior turbinate with removal of some of the inferior soft tissue  and bone. Electrocautery was used to control bleeding in the area. The remaining turbinate was then outfractured to open up the airway further. There was no significant bleeding noted. The right turbinate was then trimmed and  outfractured in a similar fashion.  The 0 scope was then used to visualize the left nasal passageway. The middle turbinate was outfractured to get into the groove between septum and middle turbinate. The sphenoid sinus opening was found and was quite narrow. A sphenoid punch was then used for opening this inferiorly and widen it some create a bigger opening into the sphenoid sinus. A cottonoid pledget was placed here for vasoconstriction.  The 0 scope was used for the right nostril then and the middle turbinate was infractured. There was oozing all around the area making it difficult to see. A side biter was used for incising the uncinate process and then the entire uncinate was removed. The natural ostia of the maxillary sinus was found and this was widened using lighting forceps backbiting forceps. Thickened mucous membranes were noted at the opening the sinus and this was so widened to create a good opening the maxillary sinus. Adenoid pledges were placed or temporarily.  The left side was visualized and cottonoid pledget removed. The middle turbinate was now infractured and some xerogel was placed into the middle meatus. Bleeding had free well stopped on this side. The right side was then visualized again with the 0 scope and the cottonoid pledget removed. There is a good opening the maxillary antrum that was verified with the image guided system. Xerogel was placed in the middle meatus to fill this area. The middle turbinates were not trimmed as there was somewhat using it is felt asked to leave these alone.  The airways were then visualized on both sides with the 0 scope, and showed open passageways on both sides that were significantly improved compared to before surgery. There was no signifcant bleeding. Nasal splints were applied to both sides of the septum using Xomed 0.47mm regular sized splints that were trimmed, and then held in position with a 3-0 Nylon through and through suture.  The  patient was turned back over to anesthesia, and awakened, extubated, and taken to the PACU in satisfactory condition.  Dispo:   PACU to home  Plan: Ice, elevation, narcotic analgesia, steroid taper, and prophylactic antibiotics for the duration of indwelling nasal foreign bodies.  We will reevaluate the patient in the office in 6 days and remove the septal splints.  Return to work in 10 days, strenuous activities in two weeks.   Jerline Linzy H 08/05/2016 9:36 AM

## 2016-08-05 NOTE — Anesthesia Procedure Notes (Signed)
Procedure Name: Intubation Performed by: Lance Muss Pre-anesthesia Checklist: Patient identified, Patient being monitored, Timeout performed, Emergency Drugs available and Suction available Patient Re-evaluated:Patient Re-evaluated prior to inductionOxygen Delivery Method: Circle system utilized Preoxygenation: Pre-oxygenation with 100% oxygen Intubation Type: IV induction Ventilation: Mask ventilation without difficulty Laryngoscope Size: Mac and 3 Grade View: Grade III Tube type: Oral Rae Tube size: 7.0 mm Number of attempts: 1 Airway Equipment and Method: Stylet Placement Confirmation: ETT inserted through vocal cords under direct vision,  positive ETCO2 and breath sounds checked- equal and bilateral Secured at: 21 cm Tube secured with: Tape Dental Injury: Teeth and Oropharynx as per pre-operative assessment  Difficulty Due To: Difficult Airway- due to anterior larynx Future Recommendations: Recommend- induction with short-acting agent, and alternative techniques readily available Comments: Anterior airway with MAC blade, recommend using video larygoscopy.

## 2016-08-05 NOTE — OR Nursing (Signed)
Dr. Kathyrn Sheriff notified of PCN allergy causing rash. Okay to receive cefazolin pre=-op

## 2016-08-05 NOTE — Anesthesia Preprocedure Evaluation (Addendum)
Anesthesia Evaluation  Patient identified by MRN, date of birth, ID band Patient awake    Reviewed: Allergy & Precautions, NPO status , Patient's Chart, lab work & pertinent test results, reviewed documented beta blocker date and time   Airway Mallampati: III  TM Distance: >3 FB     Dental  (+) Chipped   Pulmonary sleep apnea and Continuous Positive Airway Pressure Ventilation ,           Cardiovascular      Neuro/Psych    GI/Hepatic   Endo/Other  Morbid obesity  Renal/GU      Musculoskeletal   Abdominal   Peds  Hematology   Anesthesia Other Findings No anesthesia complications according to pt.  Reproductive/Obstetrics                            Anesthesia Physical Anesthesia Plan  ASA: III  Anesthesia Plan: General   Post-op Pain Management:    Induction: Intravenous  Airway Management Planned: Oral ETT  Additional Equipment:   Intra-op Plan:   Post-operative Plan:   Informed Consent: I have reviewed the patients History and Physical, chart, labs and discussed the procedure including the risks, benefits and alternatives for the proposed anesthesia with the patient or authorized representative who has indicated his/her understanding and acceptance.     Plan Discussed with: CRNA  Anesthesia Plan Comments:         Anesthesia Quick Evaluation

## 2016-08-07 LAB — SURGICAL PATHOLOGY

## 2016-08-20 ENCOUNTER — Other Ambulatory Visit: Payer: Self-pay | Admitting: Emergency Medicine

## 2016-08-20 MED ORDER — MONTELUKAST SODIUM 10 MG PO TABS: 10.0000 mg | ORAL_TABLET | Freq: Every day | ORAL | 5 refills | Status: DC

## 2016-08-20 MED ORDER — LEVOCETIRIZINE DIHYDROCHLORIDE 5 MG PO TABS
ORAL_TABLET | ORAL | 5 refills | Status: DC
Start: 2016-08-20 — End: 2018-07-21

## 2016-08-20 NOTE — Telephone Encounter (Signed)
Med refill approved, pt has hx of allergies

## 2016-09-11 ENCOUNTER — Ambulatory Visit: Payer: Self-pay | Admitting: Physician Assistant

## 2016-09-11 DIAGNOSIS — Z299 Encounter for prophylactic measures, unspecified: Secondary | ICD-10-CM

## 2016-09-11 NOTE — Progress Notes (Signed)
Patient came in to have a TB skin test done for her job requirement.

## 2016-09-13 ENCOUNTER — Ambulatory Visit: Payer: Self-pay | Admitting: Physician Assistant

## 2016-09-13 ENCOUNTER — Encounter: Payer: Self-pay | Admitting: Physician Assistant

## 2016-09-13 VITALS — BP 125/70 | HR 73 | Temp 98.4°F

## 2016-09-13 DIAGNOSIS — Z0289 Encounter for other administrative examinations: Secondary | ICD-10-CM

## 2016-09-13 NOTE — Progress Notes (Signed)
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

## 2016-09-18 ENCOUNTER — Other Ambulatory Visit: Payer: Self-pay | Admitting: Physician Assistant

## 2016-09-18 NOTE — Telephone Encounter (Signed)
Med refill approved for flonase

## 2016-09-24 LAB — TB SKIN TEST
Induration: 0 mm
TB SKIN TEST: NEGATIVE

## 2016-09-24 NOTE — Addendum Note (Signed)
Addended by: Rudene Anda T on: 09/24/2016 03:37 PM   Modules accepted: Orders

## 2017-01-22 ENCOUNTER — Ambulatory Visit: Payer: Self-pay | Admitting: Family

## 2017-01-22 VITALS — BP 120/80 | HR 75 | Temp 98.4°F

## 2017-01-22 DIAGNOSIS — J0100 Acute maxillary sinusitis, unspecified: Secondary | ICD-10-CM

## 2017-01-22 MED ORDER — LEVOFLOXACIN 500 MG PO TABS: 500.0000 mg | ORAL_TABLET | Freq: Every day | ORAL | 0 refills | Status: DC

## 2017-01-22 MED ORDER — PREDNISONE 10 MG PO TABS: 30.0000 mg | ORAL_TABLET | Freq: Every day | ORAL | 0 refills | Status: DC

## 2017-01-22 MED ORDER — FLUCONAZOLE 150 MG PO TABS: ORAL_TABLET | ORAL | 0 refills | Status: DC

## 2017-01-22 NOTE — Progress Notes (Signed)
S/ chronic allergies,  S/P Sinus surgery NOV 17 doing well until yesterday  acute onset sxs R max pain - moderate -severe  , ear pressure , malaise  , on daily regimen no body aches or gi sx O/ looks mildly ill  ENT tms very retracted, dull , R nare with swelling and purulent rhinorhea, and tenderness throat clear, neck supple  ,heart rsr lungs clear A/ acute R max.sinusitis P/ continue regimen . rx levaquin, pred pulse, diflucan . If sxs not improving f/u with ENT

## 2017-02-10 ENCOUNTER — Other Ambulatory Visit: Payer: Self-pay | Admitting: Certified Nurse Midwife

## 2017-04-15 ENCOUNTER — Other Ambulatory Visit: Payer: Self-pay | Admitting: Physician Assistant

## 2017-04-15 ENCOUNTER — Other Ambulatory Visit: Payer: Self-pay | Admitting: Certified Nurse Midwife

## 2017-04-16 NOTE — Telephone Encounter (Signed)
Med refill for singulair approved 

## 2017-04-16 NOTE — Telephone Encounter (Signed)
Originally written 09/26/16 #84 with 3 refills.

## 2017-04-18 ENCOUNTER — Other Ambulatory Visit: Payer: Self-pay | Admitting: Physician Assistant

## 2017-04-18 MED ORDER — MONTELUKAST SODIUM 10 MG PO TABS: 10.0000 mg | ORAL_TABLET | Freq: Every day | ORAL | 12 refills | Status: DC

## 2017-04-18 NOTE — Telephone Encounter (Signed)
Hey could you resend this script.  The pharmacy said they didn't receive it.

## 2017-06-20 ENCOUNTER — Ambulatory Visit: Payer: Self-pay | Admitting: Physician Assistant

## 2017-06-20 ENCOUNTER — Encounter: Payer: Self-pay | Admitting: Physician Assistant

## 2017-06-20 VITALS — BP 132/98 | HR 71 | Temp 98.4°F

## 2017-06-20 DIAGNOSIS — F988 Other specified behavioral and emotional disorders with onset usually occurring in childhood and adolescence: Secondary | ICD-10-CM

## 2017-06-20 MED ORDER — MELOXICAM 15 MG PO TABS: 15.0000 mg | ORAL_TABLET | Freq: Every day | ORAL | 12 refills | Status: DC

## 2017-06-20 MED ORDER — METHYLPHENIDATE HCL ER 36 MG PO TB24: 36.0000 mg | ORAL_TABLET | Freq: Every day | ORAL | 0 refills | Status: DC

## 2017-06-20 NOTE — Progress Notes (Signed)
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

## 2017-06-23 NOTE — Progress Notes (Signed)
Called [facility] for prior authorization for the [employer].  I spoke with Rakia at [facility] and she approved the medication for one year.  I notified [facility] of approval status.

## 2017-06-27 ENCOUNTER — Ambulatory Visit: Payer: Self-pay | Admitting: Registered Nurse

## 2017-06-27 VITALS — BP 130/80 | HR 76 | Temp 98.7°F | Resp 16

## 2017-06-27 DIAGNOSIS — H6505 Acute serous otitis media, recurrent, left ear: Secondary | ICD-10-CM

## 2017-06-27 DIAGNOSIS — J0111 Acute recurrent frontal sinusitis: Secondary | ICD-10-CM

## 2017-06-27 MED ORDER — AZITHROMYCIN 250 MG PO TABS: ORAL_TABLET | ORAL | 0 refills | Status: DC

## 2017-06-27 MED ORDER — ACETAMINOPHEN 500 MG PO TABS: 1000.0000 mg | ORAL_TABLET | Freq: Four times a day (QID) | ORAL | 0 refills | Status: AC | PRN

## 2017-06-27 MED ORDER — SALINE SPRAY 0.65 % NA SOLN: 2.0000 | NASAL | 0 refills | Status: DC

## 2017-06-27 NOTE — Patient Instructions (Signed)
Sinus Rinse What is a sinus rinse? A sinus rinse is a simple home treatment that is used to rinse your sinuses with a sterile mixture of salt and water (saline solution). Sinuses are air-filled spaces in your skull behind the bones of your face and forehead that open into your nasal cavity. You will use the following:  Saline solution.  Neti pot or spray bottle. This releases the saline solution into your nose and through your sinuses. Neti pots and spray bottles can be purchased at [employer], a health food store, or online.  When would I do a sinus rinse? A sinus rinse can help to clear mucus, dirt, dust, or pollen from the nasal cavity. You may do a sinus rinse when you have a cold, a virus, nasal allergy symptoms, a sinus infection, or stuffiness in the nose or sinuses. If you are considering a sinus rinse:  Ask your child's health care provider before performing a sinus rinse on your child.  Do not do a sinus rinse if you have had ear or nasal surgery, ear infection, or blocked ears.  How do I do a sinus rinse?  Wash your hands.  Disinfect your device according to the directions provided and then dry it.  Use the solution that comes with your device or one that is sold separately in stores. Follow the mixing directions on the package.  Fill your device with the amount of saline solution as directed by the device instructions.  Stand over a sink and tilt your head sideways over the sink.  Place the spout of the device in your upper nostril (the one closer to the ceiling).  Gently pour or squeeze the saline solution into the nasal cavity. The liquid should drain to the lower nostril if you are not overly congested.  Gently blow your nose. Blowing too hard may cause ear pain.  Repeat in the other nostril.  Clean and rinse your device with clean water and then air-dry it. Are there risks of a sinus rinse? Sinus rinse is generally very safe and effective. However,  there are a few risks, which include:  A burning sensation in the sinuses. This may happen if you do not make the saline solution as directed. Make sure to follow all directions when making the saline solution.  Infection from contaminated water. This is rare, but possible.  Nasal irritation.  This information is not intended to replace advice given to you by your health care provider. Make sure you discuss any questions you have with your health care provider. Document Released: 04/06/2014 Document Revised: 08/06/2016 Document Reviewed: 01/25/2014 Elsevier Interactive Patient Education  2017 [employer] Sinusitis, Adult Sinusitis is soreness and inflammation of your sinuses. Sinuses are hollow spaces in the bones around your face. Your sinuses are located:  Around your eyes.  In the middle of your forehead.  Behind your nose.  In your cheekbones.  Your sinuses and nasal passages are lined with a stringy fluid (mucus). Mucus normally drains out of your sinuses. When your nasal tissues become inflamed or swollen, the mucus can become trapped or blocked so air cannot flow through your sinuses. This allows bacteria, viruses, and funguses to grow, which leads to infection. Sinusitis can develop quickly and last for 7?10 days (acute) or for more than 12 weeks (chronic). Sinusitis often develops after a cold. What are the causes? This condition is caused by anything that creates swelling in the sinuses or stops mucus from draining, including:  Allergies.  Asthma.  Bacterial or viral infection.  Abnormally shaped bones between the nasal passages.  Nasal growths that contain mucus (nasal polyps).  Narrow sinus openings.  Pollutants, such as chemicals or irritants in the air.  A foreign object stuck in the nose.  A fungal infection. This is rare.  What increases the risk? The following factors may make you more likely to develop this condition:  Having allergies or  asthma.  Having had a recent cold or respiratory tract infection.  Having structural deformities or blockages in your nose or sinuses.  Having a weak immune system.  Doing a lot of swimming or diving.  Overusing nasal sprays.  Smoking.  What are the signs or symptoms? The main symptoms of this condition are pain and a feeling of pressure around the affected sinuses. Other symptoms include:  Upper toothache.  Earache.  Headache.  Bad breath.  Decreased sense of smell and taste.  A cough that may get worse at night.  Fatigue.  Fever.  Thick drainage from your nose. The drainage is often green and it may contain pus (purulent).  Stuffy nose or congestion.  Postnasal drip. This is when extra mucus collects in the throat or back of the nose.  Swelling and warmth over the affected sinuses.  Sore throat.  Sensitivity to light.  How is this diagnosed? This condition is diagnosed based on symptoms, a medical history, and a physical exam. To find out if your condition is acute or chronic, your health care provider may:  Look in your nose for signs of nasal polyps.  Tap over the affected sinus to check for signs of infection.  View the inside of your sinuses using an imaging device that has a light attached (endoscope).  If your health care provider suspects that you have chronic sinusitis, you may also:  Be tested for allergies.  Have a sample of mucus taken from your nose (nasal culture) and checked for bacteria.  Have a mucus sample examined to see if your sinusitis is related to an allergy.  If your sinusitis does not respond to treatment and it lasts longer than 8 weeks, you may have an MRI or CT scan to check your sinuses. These scans also help to determine how severe your infection is. In rare cases, a bone biopsy may be done to rule out more serious types of fungal sinus disease. How is this treated? Treatment for sinusitis depends on the cause and  whether your condition is chronic or acute. If a virus is causing your sinusitis, your symptoms will go away on their own within 10 days. You may be given medicines to relieve your symptoms, including:  Topical nasal decongestants. They shrink swollen nasal passages and let mucus drain from your sinuses.  Antihistamines. These drugs block inflammation that is triggered by allergies. This can help to ease swelling in your nose and sinuses.  Topical nasal corticosteroids. These are nasal sprays that ease inflammation and swelling in your nose and sinuses.  Nasal saline washes. These rinses can help to get rid of thick mucus in your nose.  If your condition is caused by bacteria, you will be given an antibiotic medicine. If your condition is caused by a fungus, you will be given an antifungal medicine. Surgery may be needed to correct underlying conditions, such as narrow nasal passages. Surgery may also be needed to remove polyps. Follow these instructions at home: Medicines  Take, use, or apply over-the-counter and prescription medicines only as told by  your health care provider. These may include nasal sprays.  If you were prescribed an antibiotic medicine, take it as told by your health care provider. Do not stop taking the antibiotic even if you start to feel better. Hydrate and Humidify  Drink enough water to keep your urine clear or pale yellow. Staying hydrated will help to thin your mucus.  Use a cool mist humidifier to keep the humidity level in your home above 50%.  Inhale steam for 10-15 minutes, 3-4 times a day or as told by your health care provider. You can do this in the bathroom while a hot shower is running.  Limit your exposure to cool or dry air. Rest  Rest as much as possible.  Sleep with your head raised (elevated).  Make sure to get enough sleep each night. General instructions  Apply a warm, moist washcloth to your face 3-4 times a day or as told by your  health care provider. This will help with discomfort.  Wash your hands often with soap and water to reduce your exposure to viruses and other germs. If soap and water are not available, use hand sanitizer.  Do not smoke. Avoid being around people who are smoking (secondhand smoke).  Keep all follow-up visits as told by your health care provider. This is important. Contact a health care provider if:  You have a fever.  Your symptoms get worse.  Your symptoms do not improve within 10 days. Get help right away if:  You have a severe headache.  You have persistent vomiting.  You have pain or swelling around your face or eyes.  You have vision problems.  You develop confusion.  Your neck is stiff.  You have trouble breathing. This information is not intended to replace advice given to you by your health care provider. Make sure you discuss any questions you have with your health care provider. Document Released: 09/09/2005 Document Revised: 05/05/2016 Document Reviewed: 07/05/2015 Elsevier Interactive Patient Education  2017 [employer]. Otitis Media, Adult Otitis media is redness, soreness, and puffiness (swelling) in the space just behind your eardrum (middle ear). It may be caused by allergies or infection. It often happens along with a cold. Follow these instructions at home:  Take your medicine as told. Finish it even if you start to feel better.  Only take over-the-counter or prescription medicines for pain, discomfort, or fever as told by your doctor.  Follow up with your doctor as told. Contact a doctor if:  You have otitis media only in one ear, or bleeding from your nose, or both.  You notice a lump on your neck.  You are not getting better in 3-5 days.  You feel worse instead of better. Get help right away if:  You have pain that is not helped with medicine.  You have puffiness, redness, or pain around your ear.  You get a stiff neck.  You cannot move  part of your face (paralysis).  You notice that the bone behind your ear hurts when you touch it. This information is not intended to replace advice given to you by your health care provider. Make sure you discuss any questions you have with your health care provider. Document Released: 02/26/2008 Document Revised: 02/15/2016 Document Reviewed: 04/06/2013 Elsevier Interactive Patient Education  2017 [employer].

## 2017-06-27 NOTE — Progress Notes (Signed)
Subjective:    Patient ID: Barbara Horn, female    DOB: [birth year], 36 y.o.   MRN: [id number]  36y/o caucasian female established patient here for sinus pain/pressure, sore throat, ear discomfort, hoarse voice, post nasal drip and rhinitis.  Tried OTC sudafed and symptoms worsening despite her flonase, sinus rinse, xyzal and singulair use also.  One child sick at daycare with URI symptoms this week "typically I don't get sick from the kids, especially since I had my sinus surgery last year"  Last URI levaquin saw Ciro Backer NP May 2018  I had been on so many antibiotics after sinus surgery and nothing was working.  In the past azithromycin worked well allergic sulfa and penicillin.  Sudafed usually stops sinus problems but not this time."  Chills this morning       Review of Systems  Constitutional: Positive for chills. Negative for activity change, appetite change, diaphoresis, fatigue, fever and unexpected weight change.  HENT: Positive for congestion, ear pain, postnasal drip, rhinorrhea, sinus pain, sinus pressure, sore throat and voice change. Negative for dental problem, drooling, ear discharge, facial swelling, hearing loss, mouth sores, nosebleeds, sneezing, tinnitus and trouble swallowing.   Eyes: Negative for photophobia, pain, discharge, redness, itching and visual disturbance.  Respiratory: Positive for cough. Negative for choking, chest tightness, shortness of breath, wheezing and stridor.   Cardiovascular: Negative for chest pain, palpitations and leg swelling.  Gastrointestinal: Negative for abdominal distention, abdominal pain, blood in stool, constipation, diarrhea, nausea and vomiting.  Endocrine: Negative for cold intolerance and heat intolerance.  Genitourinary: Negative for difficulty urinating, dysuria and hematuria.  Musculoskeletal: Negative for arthralgias, back pain, gait problem, joint swelling, myalgias, neck pain and neck stiffness.  Skin: Negative for color  change, pallor, rash and wound.  Allergic/Immunologic: Positive for environmental allergies and immunocompromised state. Negative for food allergies.  Neurological: Positive for headaches. Negative for dizziness, tremors, seizures, syncope, facial asymmetry, speech difficulty, weakness, light-headedness and numbness.  Hematological: Negative for adenopathy. Does not bruise/bleed easily.  Psychiatric/Behavioral: Positive for sleep disturbance. Negative for agitation, behavioral problems and confusion.       Objective:   Physical Exam  Constitutional: She is oriented to person, place, and time. Vital signs are normal. She appears well-developed and well-nourished. She is active and cooperative.  Non-toxic appearance. She does not have a sickly appearance. She does not appear ill. No distress.  HENT:  Head: Normocephalic and atraumatic.  Right Ear: Hearing, external ear and ear canal normal. A middle ear effusion is present.  Left Ear: Hearing, external ear and ear canal normal. Tympanic membrane is scarred, erythematous and bulging. A middle ear effusion is present.  Nose: Mucosal edema and rhinorrhea present. No nose lacerations, sinus tenderness, nasal deformity, septal deviation or nasal septal hematoma. No epistaxis.  No foreign bodies. Right sinus exhibits frontal sinus tenderness. Right sinus exhibits no maxillary sinus tenderness. Left sinus exhibits no maxillary sinus tenderness and no frontal sinus tenderness.  Mouth/Throat: Uvula is midline and mucous membranes are normal. Mucous membranes are not pale, not dry and not cyanotic. She does not have dentures. No oral lesions. No trismus in the jaw. Normal dentition. No dental abscesses, uvula swelling, lacerations or dental caries. Posterior oropharyngeal edema and posterior oropharyngeal erythema present. No oropharyngeal exudate or tonsillar abscesses.  Oropharynx edema erythema macular; cobblestoning posterior pharynx; nasally voice;  bilateral allergic shiners; bilateral TMs air fluid level clear; left TM erythema/bulging and auditory canal with erythema; 12-6 o'clock scarring left TM centrally;  bilateral nasal turbinates edema/erythema clear yellow discharge  Eyes: Pupils are equal, round, and reactive to light. Conjunctivae, EOM and lids are normal. Right eye exhibits no chemosis, no discharge, no exudate and no hordeolum. No foreign body present in the right eye. Left eye exhibits no chemosis, no discharge, no exudate and no hordeolum. No foreign body present in the left eye. Right conjunctiva is not injected. Right conjunctiva has no hemorrhage. Left conjunctiva is not injected. Left conjunctiva has no hemorrhage. No scleral icterus. Right eye exhibits normal extraocular motion and no nystagmus. Left eye exhibits normal extraocular motion and no nystagmus. Right pupil is round and reactive. Left pupil is round and reactive. Pupils are equal.  Neck: Trachea normal, normal range of motion and phonation normal. Neck supple. No tracheal tenderness and no muscular tenderness present. No neck rigidity. No tracheal deviation, no edema, no erythema and normal range of motion present. No thyroid mass and no thyromegaly present.  Cardiovascular: Normal rate, regular rhythm, S1 normal, S2 normal, normal heart sounds and intact distal pulses.  PMI is not displaced.  Exam reveals no gallop and no friction rub.   No murmur heard. Pulmonary/Chest: Effort normal and breath sounds normal. No accessory muscle usage or stridor. No respiratory distress. She has no decreased breath sounds. She has no wheezes. She has no rhonchi. She has no rales. She exhibits no tenderness.  Spoke full sentences without difficulty; cough not observed in exam room  Abdominal: Soft. She exhibits distension. She exhibits no fluid wave and no ascites. There is no rigidity and no guarding.  Musculoskeletal: Normal range of motion. She exhibits no edema or tenderness.        Right shoulder: Normal.       Left shoulder: Normal.       Right elbow: Normal.      Left elbow: Normal.       Right hip: Normal.       Left hip: Normal.       Right knee: Normal.       Left knee: Normal.       Cervical back: Normal.       Thoracic back: Normal.       Lumbar back: Normal.       Right hand: Normal.       Left hand: Normal.  Lymphadenopathy:       Head (right side): No submental, no submandibular, no tonsillar, no preauricular, no posterior auricular and no occipital adenopathy present.       Head (left side): No submental, no submandibular, no tonsillar, no preauricular, no posterior auricular and no occipital adenopathy present.    She has no cervical adenopathy.       Right cervical: No superficial cervical, no deep cervical and no posterior cervical adenopathy present.      Left cervical: No superficial cervical, no deep cervical and no posterior cervical adenopathy present.  Neurological: She is alert and oriented to person, place, and time. She has normal strength. She is not disoriented. She displays no atrophy and no tremor. No cranial nerve deficit or sensory deficit. She exhibits normal muscle tone. She displays no seizure activity. Coordination and gait normal. GCS eye subscore is 4. GCS verbal subscore is 5. GCS motor subscore is 6.  On/off exam table without difficulty; gait sure and steady in hall  Skin: Skin is warm, dry and intact. No abrasion, no bruising, no burn, no ecchymosis, no laceration, no lesion, no petechiae and no rash noted. She  is not diaphoretic. No cyanosis or erythema. No pallor. Nails show no clubbing.  Psychiatric: She has a normal mood and affect. Her speech is normal and behavior is normal. Judgment and thought content normal. Cognition and memory are normal.  Nursing note and vitals reviewed.         Assessment & Plan:  A-acute left otitis media recurrent; acute recurrent frontal sinusitis, morbid obesity  P-Allergic penicillin and  sulfa.  Azithromycin 500mg  po today then 250mg  po daily days 2-5 #6 RF0 electronic Rx given. Tylenol 1000mg  po qid prn pain. Supportive treatment.   No evidence of invasive bacterial infection, non toxic and well hydrated.  This is most likely self limiting viral infection.  I do not see where any further testing or imaging is necessary at this time.   I will suggest supportive care, rest, good hygiene and encourage the patient to take adequate fluids.  The patient is to return to clinic or EMERGENCY ROOM if symptoms worsen or change significantly e.g. ear pain, fever, purulent discharge from ears or bleeding.  Exitcare handout on otitis media given to patient.  Patient verbalized agreement and understanding of treatment plan.  Patient had no further questions at this time  continue flonase 1 spray each nostril BID, saline 2 sprays each nostril q2h wa prn congestion, singulair 10mg  po qhs and xyzal 5mg  po daily.  Start Azithromycin 500mg  po today then 250mg  po daily days 2-5 #6 RF0 electronic Rx given. Tylenol 1000mg  po qid prn pain.  Denied personal or family history of ENT cancer.  Shower BID especially prior to bed. No evidence of systemic bacterial infection, non toxic and well hydrated.  I do not see where any further testing or imaging is necessary at this time.   I will suggest supportive care, rest, good hygiene and encourage the patient to take adequate fluids.  The patient is to return to clinic or EMERGENCY ROOM if symptoms worsen or change significantly.  Exitcare handout on sinusitis and sinus rinse given to patient.  Patient verbalized agreement and understanding of treatment plan and had no further questions at this time.   P2:  Hand washing and cover cough  Recommend weight loss, follow up with PCM.  Exercise 150 minutes per week.

## 2017-10-29 ENCOUNTER — Encounter: Payer: Self-pay | Admitting: Family Medicine

## 2017-10-29 ENCOUNTER — Ambulatory Visit: Payer: Self-pay | Admitting: Family Medicine

## 2017-10-29 VITALS — BP 110/80 | HR 86 | Temp 98.5°F | Resp 16

## 2017-10-29 DIAGNOSIS — M7050 Other bursitis of knee, unspecified knee: Secondary | ICD-10-CM

## 2017-10-29 NOTE — Progress Notes (Signed)
Patient ID: Barbara Horn, female    DOB: 01/19/1981  Age: 37 y.o. MRN: [id number]  Chief Complaint  Patient presents with  . Knee Pain    Bilat    Subjective:   37 year old lady who has been having pain in her left knee for several weeks.  It started on the medial aspect of the knee.  Then she developed lateral thigh pain on the left, and now she is hurting in her right knee and leg also.  She is very obese, has lost 15 pounds since [place].  She bowls regularly.  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  Current allergies, medications, problem list, past/family and social histories reviewed.  Objective:  BP 110/80   Pulse 86   Temp 98.5 F (36.9 C) (Oral)   Resp 16   SpO2 95%   Very tender in the bursal area of left medial knee just below the joint line.  No crepitance.  No effusion of the joint.  Other areas are little tender on her thigh and right leg but nothing like the point tenderness of the bursa region.  Assessment & Plan:   Assessment: 1. Pes anserine bursitis       Plan: Pedis anserine bursitis, recommend continuing her inside, icing, resting, and seeing an orthopedic or sports medicine doctor.  No orders of the defined types were placed in this encounter.   No orders of the defined types were placed in this encounter.        Patient Instructions  Recommend that you ice the knee frequently, continue taking the meloxicam, try not to strain it, and make an appointment to see a sports medicine or orthopedic doctor.     Pes Anserine Bursitis The pes anserine is an area on the inside of your knee, just below the joint, which is cushioned by a fluid-filled sac (bursa). Pes anserine bursitis is a condition that happens when this bursa gets swollen and irritated. The condition causes knee pain. What are the causes? This condition may be caused by:  Making the same movement over and over.  A hit to the inside of the leg.  What increases the  risk? This injury is most likely to develop in:  Runners.  Athletes who play sports that involve a lot of running and quick side-to-side movements (cutting).  Athletes who play contact sports.  People who swim using an inward angle of the knee, such as with the breaststroke.  People with tight hamstring muscles.  Females.  People who are overweight.  People with flat feet.  People who have diabetes or osteoarthritis.  What are the signs or symptoms? Symptoms of this condition include:  Knee pain that gets better with rest and worse with activities like climbing stairs, walking, running, or getting in and out of a chair (common).  Swelling.  Warmth.  Tenderness when pressing at the inside of the lower leg, just below the knee.  How is this diagnosed? This condition may be diagnosed based on:  Your symptoms.  Your medical history.  A physical exam.  Tests, such as: ? X-rays. ? MRI and ultrasound. These tests are done to check for swelling and fluid buildup in the bursa and to look at muscles and tendons.  During your physical exam, your health care provider will press on the tendon attachment to see if you feel pain. He or she may also check your hip and knee motion and strength. How is this treated? This  condition may be treated by:  Resting your knee.  Avoiding activities that cause pain.  Icing the inside of your knee.  Raising (elevating) your knee while resting.  Sleeping with a pillow between your knees. This will cushion your injured knee.  Taking medicine to reduce pain and swelling.  Having medicines injected into your knee.  Doing strengthening and stretching exercises (physical therapy).  If these treatments do not work or if the condition keeps coming back, you may need to have surgery to remove the bursa. Follow these instructions at home: Managing pain, stiffness, and swelling  If directed, apply ice to your knee. ? Put ice in a  plastic bag. ? Place a towel between your skin and the bag. ? Leave the ice on for 20 minutes, 2-3 times a day.  While you are sitting, elevate your knee.  While you are lying down, elevate your knee above the level of your heart.  Take over-the-counter and prescription medicines only as told by your health care provider. Activity  Return to your normal activities as told by your health care provider. Ask your health care provider what activities are safe for you.  Do exercises as told by your health care provider. General instructions  Sleep with a pillow between your knees.  Do not use any tobacco products, such as cigarettes, chewing tobacco, and e-cigarettes. Tobacco can delay healing. If you need help quitting, ask your health care provider.  Keep all follow-up visits as told by your health care provider. This is important. How is this prevented?  Warm up and stretch before being active.  Cool down and stretch after being active.  Give your body time to rest between periods of activity.  Make sure to use equipment that fits you.  Be safe and responsible while being active to avoid falls.  Do at least 150 minutes of moderate-intensity exercise each week, such as brisk walking or water aerobics.  Maintain physical fitness, including: ? Strength. ? Flexibility. ? Cardiovascular fitness. ? Endurance. Contact a health care provider if:  Your symptoms do not improve.  Your symptoms get worse. This information is not intended to replace advice given to you by your health care provider. Make sure you discuss any questions you have with your health care provider. Document Released: 09/09/2005 Document Revised: 05/14/2016 Document Reviewed: 08/25/2015 Elsevier Interactive Patient Education  [employer].      Return if symptoms worsen or fail to improve.   Barbara Hobbs, MD 10/29/2017

## 2017-10-29 NOTE — Patient Instructions (Signed)
Recommend that you ice the knee frequently, continue taking the meloxicam, try not to strain it, and make an appointment to see a sports medicine or orthopedic doctor.     Pes Anserine Bursitis The pes anserine is an area on the inside of your knee, just below the joint, which is cushioned by a fluid-filled sac (bursa). Pes anserine bursitis is a condition that happens when this bursa gets swollen and irritated. The condition causes knee pain. What are the causes? This condition may be caused by:  Making the same movement over and over.  A hit to the inside of the leg.  What increases the risk? This injury is most likely to develop in:  Runners.  Athletes who play sports that involve a lot of running and quick side-to-side movements (cutting).  Athletes who play contact sports.  People who swim using an inward angle of the knee, such as with the breaststroke.  People with tight hamstring muscles.  Females.  People who are overweight.  People with flat feet.  People who have diabetes or osteoarthritis.  What are the signs or symptoms? Symptoms of this condition include:  Knee pain that gets better with rest and worse with activities like climbing stairs, walking, running, or getting in and out of a chair (common).  Swelling.  Warmth.  Tenderness when pressing at the inside of the lower leg, just below the knee.  How is this diagnosed? This condition may be diagnosed based on:  Your symptoms.  Your medical history.  A physical exam.  Tests, such as: ? X-rays. ? MRI and ultrasound. These tests are done to check for swelling and fluid buildup in the bursa and to look at muscles and tendons.  During your physical exam, your health care provider will press on the tendon attachment to see if you feel pain. He or she may also check your hip and knee motion and strength. How is this treated? This condition may be treated by:  Resting your knee.  Avoiding  activities that cause pain.  Icing the inside of your knee.  Raising (elevating) your knee while resting.  Sleeping with a pillow between your knees. This will cushion your injured knee.  Taking medicine to reduce pain and swelling.  Having medicines injected into your knee.  Doing strengthening and stretching exercises (physical therapy).  If these treatments do not work or if the condition keeps coming back, you may need to have surgery to remove the bursa. Follow these instructions at home: Managing pain, stiffness, and swelling  If directed, apply ice to your knee. ? Put ice in a plastic bag. ? Place a towel between your skin and the bag. ? Leave the ice on for 20 minutes, 2-3 times a day.  While you are sitting, elevate your knee.  While you are lying down, elevate your knee above the level of your heart.  Take over-the-counter and prescription medicines only as told by your health care provider. Activity  Return to your normal activities as told by your health care provider. Ask your health care provider what activities are safe for you.  Do exercises as told by your health care provider. General instructions  Sleep with a pillow between your knees.  Do not use any tobacco products, such as cigarettes, chewing tobacco, and e-cigarettes. Tobacco can delay healing. If you need help quitting, ask your health care provider.  Keep all follow-up visits as told by your health care provider. This is important. How is this  prevented?  Warm up and stretch before being active.  Cool down and stretch after being active.  Give your body time to rest between periods of activity.  Make sure to use equipment that fits you.  Be safe and responsible while being active to avoid falls.  Do at least 150 minutes of moderate-intensity exercise each week, such as brisk walking or water aerobics.  Maintain physical fitness, including: ? Strength. ? Flexibility. ? Cardiovascular  fitness. ? Endurance. Contact a health care provider if:  Your symptoms do not improve.  Your symptoms get worse. This information is not intended to replace advice given to you by your health care provider. Make sure you discuss any questions you have with your health care provider. Document Released: 09/09/2005 Document Revised: 05/14/2016 Document Reviewed: 08/25/2015 Elsevier Interactive Patient Education  [employer].

## 2017-11-11 ENCOUNTER — Ambulatory Visit: Payer: Self-pay | Admitting: Emergency Medicine

## 2017-11-11 VITALS — BP 120/78 | HR 78 | Temp 97.7°F | Resp 16

## 2017-11-11 DIAGNOSIS — J0101 Acute recurrent maxillary sinusitis: Secondary | ICD-10-CM

## 2017-11-11 DIAGNOSIS — J029 Acute pharyngitis, unspecified: Secondary | ICD-10-CM

## 2017-11-11 LAB — POCT INFLUENZA A/B
INFLUENZA A, POC: NEGATIVE
Influenza B, POC: NEGATIVE

## 2017-11-11 MED ORDER — AZITHROMYCIN 250 MG PO TABS: ORAL_TABLET | ORAL | 0 refills | Status: DC

## 2017-11-11 NOTE — Patient Instructions (Signed)

## 2017-11-11 NOTE — Progress Notes (Signed)
Subjective. Patient presents with onset last night of raspy voice associated with a tickling sensation in her throat. This morning when she woke up she had very little voice. She also noted to have significant sinus congestion. She tried Tylenol and Mucinex but continues to have her symptoms. When she blows her nose and cough she does bring up smallquantities of a greenish type phlegm. Review of systems.  She does have major issues with allergies. She has a when necessary albuterol inhaler and currently is on Flonase and size all and Singulair. She has taken Zithromax in the past without difficulty forsinusitis. Objective. Patient is alert and cooperative she is not in distress. She has significant redness around the nares with redness of the nasal passages. TMs are dull bilaterally. Throat is slightly red without exudate. Neck supple without adenopathy. Chest clear to auscultation. Assessment Patient has what appears to be an upper respiratory  infection with developing sinusitis. She has a history of allergies and is currently on Xyzal, Flonase, and Singulair. She has an albuterol inhaler to use as needed. She also is forced to use a CPAP machine for obstructive sleep apnea. Flu test negative   Plan. Z-Pak for sinus disease Continue Xyzal, Flonase, Singulair, and when necessary albuterol

## 2018-03-30 ENCOUNTER — Other Ambulatory Visit: Payer: Self-pay | Admitting: Certified Nurse Midwife

## 2018-04-25 ENCOUNTER — Other Ambulatory Visit: Payer: Self-pay | Admitting: Certified Nurse Midwife

## 2018-05-06 ENCOUNTER — Telehealth: Payer: Self-pay

## 2018-05-06 NOTE — Telephone Encounter (Signed)
Please advise the pharmacy to contact the patient's primary care provider for refills.

## 2018-05-06 NOTE — Telephone Encounter (Signed)
Pharmacy notified.

## 2018-05-06 NOTE — Telephone Encounter (Signed)
Received fax requesting refill for Montelukast 10mg .  Last OV- 11/11/17

## 2018-05-22 ENCOUNTER — Ambulatory Visit: Payer: Managed Care, Other (non HMO) | Admitting: Nurse Practitioner

## 2018-05-22 ENCOUNTER — Other Ambulatory Visit: Payer: Self-pay

## 2018-05-22 ENCOUNTER — Encounter: Payer: Self-pay | Admitting: Nurse Practitioner

## 2018-05-22 VITALS — BP 115/54 | HR 55 | Temp 98.0°F | Ht 60.0 in | Wt 283.0 lb

## 2018-05-22 DIAGNOSIS — M153 Secondary multiple arthritis: Secondary | ICD-10-CM | POA: Diagnosis not present

## 2018-05-22 DIAGNOSIS — G4730 Sleep apnea, unspecified: Secondary | ICD-10-CM | POA: Insufficient documentation

## 2018-05-22 DIAGNOSIS — E282 Polycystic ovarian syndrome: Secondary | ICD-10-CM

## 2018-05-22 DIAGNOSIS — M1990 Unspecified osteoarthritis, unspecified site: Secondary | ICD-10-CM | POA: Insufficient documentation

## 2018-05-22 DIAGNOSIS — Z7689 Persons encountering health services in other specified circumstances: Secondary | ICD-10-CM | POA: Diagnosis not present

## 2018-05-22 DIAGNOSIS — Z6841 Body Mass Index (BMI) 40.0 and over, adult: Secondary | ICD-10-CM

## 2018-05-22 DIAGNOSIS — G4733 Obstructive sleep apnea (adult) (pediatric): Secondary | ICD-10-CM | POA: Diagnosis not present

## 2018-05-22 DIAGNOSIS — J302 Other seasonal allergic rhinitis: Secondary | ICD-10-CM | POA: Insufficient documentation

## 2018-05-22 MED ORDER — MELOXICAM 15 MG PO TABS: 15.0000 mg | ORAL_TABLET | Freq: Every day | ORAL | 12 refills | Status: DC

## 2018-05-22 MED ORDER — DICLOFENAC SODIUM 1 % TD GEL: 4.0000 g | Freq: Four times a day (QID) | TRANSDERMAL | 5 refills | Status: DC

## 2018-05-22 MED ORDER — EPINEPHRINE 0.3 MG/0.3ML IJ SOAJ: 0.3000 mg | Freq: Once | INTRAMUSCULAR | 1 refills | Status: AC

## 2018-05-22 NOTE — Progress Notes (Signed)
Subjective:    Patient ID: Barbara Horn, female    DOB: Mar 17, 1981, 37 y.o.   MRN: [id number]  Barbara Horn is a 37 y.o. female presenting on 05/22/2018 for Establish Care (intermittent joint pain hips, knees. Currently f/u by Endocrinology PCOS)   HPI Establish Care New Provider Pt last seen by PCP Ashok Cordia, PA-C several months ago.  Obtain records from [facility] and Care Everywhere for Endocrinologist Dr. Gabriel Carina.    PCOS Patient is seeing Dr. Gabriel Carina.  Stable on metformin 1,000 mg once daily. Has history of amenorrhea, but now has menstrual cycle at least once every 3 months after weight loss.  Patient feels endocrinology has not helped advance treatment of PCOS and would like to resume care with PCP.     Allergy Allergy meds: Now OTC Xyzal will be covered.  Takes regularly to prevent infections.  ADHD [place]: ADHD treatment in past.  Was resumed on Concerta 36 mg once daily by Ashok Cordia, PA in September 2018.  No prescriptions since.  Helps patient with keeping focused at work and for tasks at home.  Patient continues to be able to function in a reasonable capacity, but may take her longer to complete the same job.  Morbid Obesity Weight high: 325 lbs. Patient had lost weight to 255 lbs prior to stopping low carb diet and regaining about 30 lbs with calorie over nutrition.    Sleep Apnea Patient reports having trouble with CPAP machine and feeling like it may not be providing enough support overnight.  It was originally provided for titration study ordered through [facility] about 5-6 years ago.  She is using CPAP nightly, but feels there are some nights when she doesn't get adequate rest.  Past Medical History:  Diagnosis Date  . ADHD   . Allergy   . Complication of anesthesia    WHEEZING AFTER GALLBLADDER SURGERY IN MAY 2017  . PCOS (polycystic ovarian syndrome)   . PCOS (polycystic ovarian syndrome)   . Seasonal allergies   . Sleep apnea    cpcp    Past Surgical History:  Procedure Laterality Date  . CHOLECYSTECTOMY N/A 01/31/2016   Procedure: LAPAROSCOPIC CHOLECYSTECTOMY;  Surgeon: Jules Husbands, MD;  Location: [facility] ORS;  Service: General;  Laterality: N/A;  . ENDOSCOPIC CONCHA BULLOSA RESECTION Bilateral 08/05/2016   Procedure: ENDOSCOPIC CONCHA BULLOSA RESECTION;  Surgeon: Margaretha Sheffield, MD;  Location: [facility] ORS;  Service: ENT;  Laterality: Bilateral;  . FOOT SURGERY     x2  . IMAGE GUIDED SINUS SURGERY Bilateral 08/05/2016   Procedure: IMAGE GUIDED SINUS SURGERY;  Surgeon: Margaretha Sheffield, MD;  Location: [facility] ORS;  Service: ENT;  Laterality: Bilateral;  . MAXILLARY ANTROSTOMY Right 08/05/2016   Procedure: MAXILLARY ANTROSTOMY WITH TISSUE REMOVAL;  Surgeon: Margaretha Sheffield, MD;  Location: [facility] ORS;  Service: ENT;  Laterality: Right;  . MOUTH SURGERY    . SEPTOPLASTY N/A 08/05/2016   Procedure: SEPTOPLASTY;  Surgeon: Margaretha Sheffield, MD;  Location: [facility] ORS;  Service: ENT;  Laterality: N/A;  . SPHENOIDECTOMY Left 08/05/2016   Procedure: Coralee Pesa;  Surgeon: Margaretha Sheffield, MD;  Location: [facility] ORS;  Service: ENT;  Laterality: Left;  . TONSILLECTOMY    . TURBINATE REDUCTION Bilateral 08/05/2016   Procedure: TURBINATE REDUCTION BILATERAL INFERIOR;  Surgeon: Margaretha Sheffield, MD;  Location: [facility] ORS;  Service: ENT;  Laterality: Bilateral;   Social History   Socioeconomic History  . Marital status: Married    Spouse name: Not on file  .  Number of children: Not on file  . Years of education: Not on file  . Highest education level: Not on file  Occupational History  . Not on file  Social Needs  . Financial resource strain: Not on file  . Food insecurity:    Worry: Not on file    Inability: Not on file  . Transportation needs:    Medical: Not on file    Non-medical: Not on file  Tobacco Use  . Smoking status: Never Smoker  . Smokeless tobacco: Never Used  Substance and Sexual Activity  . Alcohol use: Yes    Alcohol/week: 0.0  standard drinks    Comment: occasinally  . Drug use: No  . Sexual activity: Yes    Birth control/protection: Pill  Lifestyle  . Physical activity:    Days per week: Not on file    Minutes per session: Not on file  . Stress: Not on file  Relationships  . Social connections:    Talks on phone: Not on file    Gets together: Not on file    Attends religious service: Not on file    Active member of club or organization: Not on file    Attends meetings of clubs or organizations: Not on file    Relationship status: Not on file  . Intimate partner violence:    Fear of current or ex partner: Not on file    Emotionally abused: Not on file    Physically abused: Not on file    Forced sexual activity: Not on file  Other Topics Concern  . Not on file  Social History Narrative  . Not on file   Family History  Problem Relation Age of Onset  . Diabetes Mother   . Hypertension Mother   . Diabetes Father   . Hypertension Father   . Bipolar disorder Father    Current Outpatient Medications on File Prior to Visit  Medication Sig  . albuterol (PROVENTIL HFA;VENTOLIN HFA) 108 (90 Base) MCG/ACT inhaler Inhale into the lungs every 6 (six) hours as needed for wheezing or shortness of breath.  . EPINEPHrine (EPIPEN IJ) Inject as directed as needed.  . fluticasone (FLONASE) 50 MCG/ACT nasal spray PLACE 2 SPRAYS INTO BOTH NOSTRILS DAILY.  Marland Kitchen levocetirizine (XYZAL) 5 MG tablet TAKE 1 TABLET (5 MG TOTAL) BY MOUTH EVERY EVENING.  . meloxicam (MOBIC) 15 MG tablet Take 1 tablet (15 mg total) by mouth daily.  . metFORMIN (GLUCOPHAGE-XR) 500 MG 24 hr tablet Take 1,000 mg by mouth at bedtime.  . montelukast (SINGULAIR) 10 MG tablet TAKE 1 TABLET (10 MG TOTAL) BY MOUTH AT BEDTIME. FOR ALLERGIES  . LILLOW 0.15-30 MG-MCG tablet TAKE 1 TABLET BY MOUTH DAILY. TAKE ACTIVE PILL DAILY FOR 6 WEEKS THEN SKIP 1 WEEK AND REPEAT CYCLE. (Patient not taking: Reported on 05/22/2018)  . methylphenidate 36 MG PO CR tablet Take  1 tablet (36 mg total) by mouth daily. (Patient not taking: Reported on 10/29/2017)   No current facility-administered medications on file prior to visit.     Review of Systems  Constitutional: Negative for activity change, appetite change, chills, fatigue, fever and unexpected weight change.  HENT: Negative for congestion and sore throat.   Eyes: Negative for pain and visual disturbance.  Respiratory: Negative for cough, chest tightness, shortness of breath and wheezing.   Cardiovascular: Negative for chest pain, palpitations and leg swelling.  Gastrointestinal: Negative for abdominal pain, blood in stool, constipation, diarrhea, nausea and vomiting.  Endocrine: Negative  for cold intolerance, heat intolerance, polydipsia, polyphagia and polyuria.  Genitourinary: Negative for dysuria, frequency, hematuria and urgency.  Musculoskeletal: Positive for arthralgias. Negative for back pain, gait problem, myalgias, neck pain and neck stiffness.  Skin: Negative.  Negative for rash.  Allergic/Immunologic: Positive for environmental allergies.  Neurological: Negative for dizziness, speech difficulty, weakness, light-headedness, numbness and headaches.  Hematological: Negative for adenopathy. Does not bruise/bleed easily.  Psychiatric/Behavioral: Positive for decreased concentration. Negative for dysphoric mood, sleep disturbance and suicidal ideas. The patient is not nervous/anxious.    Per HPI unless specifically indicated above    Objective:    BP (!) 115/54 (BP Location: Left Arm, Patient Position: Sitting, Cuff Size: Large)   Pulse (!) 55   Temp 98 F (36.7 C) (Oral)   Ht 5' (1.524 m)   Wt 283 lb (128.4 kg)   BMI 55.27 kg/m   Wt Readings from Last 3 Encounters:  05/22/18 283 lb (128.4 kg)  07/08/16 (!) 302 lb (137 kg)  02/21/16 293 lb 12.8 oz (133.3 kg)    Physical Exam  Constitutional: She is oriented to person, place, and time. She appears well-developed. No distress.  Morbidly  obese with central adiposity  HENT:  Head: Normocephalic and atraumatic.  Right Ear: External ear normal.  Left Ear: External ear normal.  Nose: Nose normal.  Mouth/Throat: Oropharynx is clear and moist.  Eyes: Pupils are equal, round, and reactive to light. Conjunctivae are normal.  Neck: Normal range of motion. Neck supple. No JVD present. No tracheal deviation present. No thyromegaly present.  Cardiovascular: Normal rate, regular rhythm, normal heart sounds and intact distal pulses. Exam reveals no gallop and no friction rub.  No murmur heard. Pulmonary/Chest: Effort normal and breath sounds normal. No respiratory distress.  Abdominal: Soft. Bowel sounds are normal. She exhibits no distension. There is no hepatosplenomegaly. There is no tenderness.  Musculoskeletal: Normal range of motion.  Bilateral Knees Inspection: Normal appearance and symmetrical. No ecchymosis or effusion. Palpation: Non-tender. Mild +TTP Left knee only medial joint line. No crepitus ROM: Full active ROM bilaterally Special Testing: Lachman / Valgus/Varus tests negative with intact ligaments (ACL, MCL, LCL). McMurray negative without meniscus symptoms. Strength: 5/5 intact knee flex/ext, ankle dorsi/plantarflex Neurovascular: distally intact sensation light touch and pulses   Lymphadenopathy:    She has no cervical adenopathy.  Neurological: She is alert and oriented to person, place, and time. No cranial nerve deficit.  Skin: Skin is warm and dry. Capillary refill takes less than 2 seconds.  Psychiatric: She has a normal mood and affect. Her behavior is normal. Judgment and thought content normal.  Nursing note and vitals reviewed.    Results for orders placed or performed in visit on 11/11/17  POCT Influenza A/B (POC66)  Result Value Ref Range   Influenza A, POC Negative Negative   Influenza B, POC Negative Negative      Assessment & Plan:   Problem List Items Addressed This Visit      Respiratory    Sleep apnea Generally worsening per report as patient feels sleep quality has decreased over recent months to 1 year.  Uses CPAP nightly.  Plan: 1. Recommend repeat CPAP titration study to evaluate.  Will likely need to obtain original sleep study results from [facility] Referral to [facility] for titration. 2. Continue using CPAP nightly. 3. Followup as needed.     Endocrine   PCOS (polycystic ovarian syndrome) Stable on metformin currently.  Menses remain irregular.  Contraception not  currently needed as partner has had vascetomy.  Patient continues having difficulty with weight management, but continues to struggle with calorie over nutrition.  Plan: 1. Continue management with Dr. Gabriel Carina for upcoming appointment.  Discuss weight management as well.  May return to PCP management if stable treatment plan. 2. Followup every 3 months here if returning to PCP for management.        Other   Morbid obesity with BMI of 50.0-59.9, adult ([place]) Worsening again without successful weight maintenance after weight loss.  Patient has had return to calorie overnutrition with dietary indiscretions.  Complicates PCOS, irregular menses, and arthritis.  Plan: 1. START low carb diet again.  Reviewed low glycemic diet handout for higher value carbohydrates. 2. Continue management of comorbidities. 3. Followup prn.   Relevant Medications   meloxicam (MOBIC) 15 MG tablet   diclofenac sodium (VOLTAREN) 1 % GEL   Seasonal allergies Stable on Xyzal. Continue OTC medication once daily.  Followup prn    Other Visit Diagnoses    Other secondary osteoarthritis of multiple sites    -  Primary Stable, worsened by morbid obesity.  Patient has good control of pain with meloxicam on daily basis to allow daily activities.    Plan: 1. Continue meloxicam 15 mg once daily 2. Monitor kidney function again at next appointment. 3. START diclofenac gel 1% for breakthrough pain relief.  Continue  acetaminophen prn. 4. Recommend regular activity, limited to prevent overexertion and excessive stress on joints. 5. Work toward healthier weight into Obesity class 3 4. Followup prn   Relevant Medications   meloxicam (MOBIC) 15 MG tablet   diclofenac sodium (VOLTAREN) 1 % GEL   Encounter to establish care     Previous PCP [facility] at [facility].  Records are reviewed in [facility].  Past medical, family, and surgical history reviewed w/ patient in clinic today.       Meds ordered this encounter  Medications  . EPINEPHrine 0.3 mg/0.3 mL IJ SOAJ injection    Sig: Inject 0.3 mLs (0.3 mg total) into the muscle once for 1 dose.    Dispense:  2 Device    Refill:  1    Order Specific Question:   Supervising Provider    Answer:   Olin Hauser [[id number]]  . meloxicam (MOBIC) 15 MG tablet    Sig: Take 1 tablet (15 mg total) by mouth daily.    Dispense:  30 tablet    Refill:  12    Order Specific Question:   Supervising Provider    Answer:   Olin Hauser [[id number]]  . diclofenac sodium (VOLTAREN) 1 % GEL    Sig: Apply 4 g topically 4 (four) times daily.    Dispense:  100 g    Refill:  5    Order Specific Question:   Supervising Provider    Answer:   Olin Hauser [[id number]]    Follow up plan: Return in about 3 months (around 08/22/2018) for annual physical.  Cassell Smiles, DNP, AGPCNP-BC Adult Gerontology Primary Care Nurse Practitioner [facility] 05/22/2018, 10:23 AM

## 2018-05-22 NOTE — Patient Instructions (Addendum)
Barbara Horn,   Thank you for coming in to clinic today.  1. ADD diclofenac1% for increased joint pain if meloxicam not sufficient.  2. Continue work toward your weight loss goal of < 250 lbs.  Work toward March 1st.  3. Continue care for your PCOS with Dr. Gabriel Carina  Please schedule a follow-up appointment with Cassell Smiles, AGNP. Return in about 3 months (around 08/22/2018) for annual physical.  If you have any other questions or concerns, please feel free to call the clinic or send a message through [place]. You may also schedule an earlier appointment if necessary.  You will receive a survey after today's visit either digitally by e-mail or paper by [employer]. Your experiences and feedback matter to [place].  Please respond so we know how we are doing as we provide care for you.   Cassell Smiles, DNP, AGNP-BC Adult Gerontology Nurse Practitioner [place]

## 2018-05-28 ENCOUNTER — Other Ambulatory Visit: Payer: Self-pay | Admitting: Nurse Practitioner

## 2018-05-28 DIAGNOSIS — G4733 Obstructive sleep apnea (adult) (pediatric): Secondary | ICD-10-CM

## 2018-05-28 NOTE — Progress Notes (Signed)
Use last OV note to complete referral.

## 2018-06-06 ENCOUNTER — Emergency Department
Admission: EM | Admit: 2018-06-06 | Discharge: 2018-06-07 | Disposition: A | Discharge status: Home / Self Care | Payer: Managed Care, Other (non HMO) | Attending: Emergency Medicine | Admitting: Emergency Medicine

## 2018-06-06 ENCOUNTER — Emergency Department: Payer: Managed Care, Other (non HMO)

## 2018-06-06 ENCOUNTER — Other Ambulatory Visit: Payer: Self-pay

## 2018-06-06 DIAGNOSIS — R1031 Right lower quadrant pain: Secondary | ICD-10-CM | POA: Diagnosis not present

## 2018-06-06 DIAGNOSIS — Z7984 Long term (current) use of oral hypoglycemic drugs: Secondary | ICD-10-CM | POA: Insufficient documentation

## 2018-06-06 DIAGNOSIS — R102 Pelvic and perineal pain: Secondary | ICD-10-CM

## 2018-06-06 DIAGNOSIS — Z79899 Other long term (current) drug therapy: Secondary | ICD-10-CM | POA: Diagnosis not present

## 2018-06-06 LAB — COMPREHENSIVE METABOLIC PANEL
ALK PHOS: 48 U/L (ref 38–126)
ALT: 18 U/L (ref 0–44)
ANION GAP: 7 (ref 5–15)
AST: 19 U/L (ref 15–41)
Albumin: 4.5 g/dL (ref 3.5–5.0)
BILIRUBIN TOTAL: 0.5 mg/dL (ref 0.3–1.2)
BUN: 14 mg/dL (ref 6–20)
CALCIUM: 9.3 mg/dL (ref 8.9–10.3)
CO2: 25 mmol/L (ref 22–32)
Chloride: 108 mmol/L (ref 98–111)
Creatinine, Ser: 0.57 mg/dL (ref 0.44–1.00)
GFR calc Af Amer: >60 mL/min (ref >60)
GFR calc non Af Amer: >60 mL/min (ref >60)
GLUCOSE: 83 mg/dL (ref 70–99)
Potassium: 3.8 mmol/L (ref 3.5–5.1)
Sodium: 140 mmol/L (ref 135–145)
TOTAL PROTEIN: 7.9 g/dL (ref 6.5–8.1)

## 2018-06-06 LAB — URINALYSIS, COMPLETE (UACMP) WITH MICROSCOPIC
Bacteria, UA: NONE SEEN
Bilirubin Urine: NEGATIVE
Glucose, UA: NEGATIVE mg/dL
Hgb urine dipstick: NEGATIVE
Ketones, ur: NEGATIVE mg/dL
Leukocytes, UA: NEGATIVE
Nitrite: NEGATIVE
PROTEIN: NEGATIVE mg/dL
SPECIFIC GRAVITY, URINE: 1.018 (ref 1.005–1.030)
pH: 7 (ref 5.0–8.0)

## 2018-06-06 LAB — CBC
HCT: 42.1 % (ref 35.0–47.0)
Hemoglobin: 14.4 g/dL (ref 12.0–16.0)
MCH: 29.2 pg (ref 26.0–34.0)
MCHC: 34.1 g/dL (ref 32.0–36.0)
MCV: 85.5 fL (ref 80.0–100.0)
Platelets: 200 10*3/uL (ref 150–440)
RBC: 4.93 MIL/uL (ref 3.80–5.20)
RDW: 14.2 % (ref 11.5–14.5)
WBC: 7.9 10*3/uL (ref 3.6–11.0)

## 2018-06-06 LAB — POCT PREGNANCY, URINE: Preg Test, Ur: NEGATIVE

## 2018-06-06 LAB — LIPASE, BLOOD: Lipase: 34 U/L (ref 11–51)

## 2018-06-06 MED ORDER — IOPAMIDOL (ISOVUE-370) INJECTION 76%
100.0000 mL | Freq: Once | INTRAVENOUS | Status: AC | PRN
  Administered 2018-06-06: 100 mL via INTRAVENOUS

## 2018-06-06 NOTE — ED Notes (Signed)
Patient taken to CT scan.

## 2018-06-06 NOTE — ED Triage Notes (Signed)
C/o RLQ abd pain intermittently x 2 days. No gallbladder. No N&V&D or fever. Alert, oriented, ambulatory. Passing gas, regular BM. Denies urinary symptoms.

## 2018-06-06 NOTE — Discharge Instructions (Signed)
Please seek medical attention for any high fevers, chest pain, shortness of breath, change in behavior, persistent vomiting, bloody stool or any other new or concerning symptoms.  

## 2018-06-06 NOTE — ED Notes (Signed)
Dr. Goodman at bedside.  

## 2018-06-06 NOTE — ED Notes (Signed)
This RN attempted blood draw x 2. Pt states she is hard stick and usually has to have ultrasound to get blood work. Will obtain order for IV team.

## 2018-06-06 NOTE — ED Provider Notes (Signed)
[facility] Emergency Department Provider Note   ____________________________________________   I have reviewed the triage vital signs and the nursing notes.   HISTORY  Chief Complaint Abdominal Pain   History limited by: Not Limited   HPI Barbara Horn is a 37 y.o. female who presents to the emergency department today because of concerns for abdominal pain.  Pain is located in the right lower quadrant.  Its been present for the past 2 days.  Initially was intermittent.  However it is now become more constant.  She denies any radiation.  She has not noticed any nausea or vomiting.  Denies any change in defecation or urination.  No abnormal vaginal discharge.  No fevers.  Denies similar pain in the past.   Per medical record review patient has a history of PCOS  Past Medical History:  Diagnosis Date  . ADHD   . Allergy   . Arthritis   . Complication of anesthesia    WHEEZING AFTER GALLBLADDER SURGERY IN MAY 2017  . Noninflammatory disorder of ovary, fallopian tube, and broad ligament 07/05/2015  . PCOS (polycystic ovarian syndrome)   . Plantar fasciitis, bilateral   . Seasonal allergies   . Sleep apnea    cpap    Patient Active Problem List   Diagnosis Date Noted  . Sleep apnea   . Seasonal allergies   . PCOS (polycystic ovarian syndrome)   . Arthritis   . Bilateral ovarian cysts 09/20/2015  . Noninflammatory disorder of ovary, fallopian tube, and broad ligament 07/05/2015  . Morbid obesity with BMI of 50.0-59.9, adult ([place]) 06/28/2015    Past Surgical History:  Procedure Laterality Date  . CHOLECYSTECTOMY N/A 01/31/2016   Procedure: LAPAROSCOPIC CHOLECYSTECTOMY;  Surgeon: Jules Husbands, MD;  Location: [facility] ORS;  Service: General;  Laterality: N/A;  . ENDOSCOPIC CONCHA BULLOSA RESECTION Bilateral 08/05/2016   Procedure: ENDOSCOPIC CONCHA BULLOSA RESECTION;  Surgeon: Margaretha Sheffield, MD;  Location: [facility] ORS;  Service: ENT;  Laterality: Bilateral;   . FOOT SURGERY     x2  . IMAGE GUIDED SINUS SURGERY Bilateral 08/05/2016   Procedure: IMAGE GUIDED SINUS SURGERY;  Surgeon: Margaretha Sheffield, MD;  Location: [facility] ORS;  Service: ENT;  Laterality: Bilateral;  . MAXILLARY ANTROSTOMY Right 08/05/2016   Procedure: MAXILLARY ANTROSTOMY WITH TISSUE REMOVAL;  Surgeon: Margaretha Sheffield, MD;  Location: [facility] ORS;  Service: ENT;  Laterality: Right;  . MOUTH SURGERY    . SEPTOPLASTY N/A 08/05/2016   Procedure: SEPTOPLASTY;  Surgeon: Margaretha Sheffield, MD;  Location: [facility] ORS;  Service: ENT;  Laterality: N/A;  . SPHENOIDECTOMY Left 08/05/2016   Procedure: Coralee Pesa;  Surgeon: Margaretha Sheffield, MD;  Location: [facility] ORS;  Service: ENT;  Laterality: Left;  . TONSILLECTOMY    . TURBINATE REDUCTION Bilateral 08/05/2016   Procedure: TURBINATE REDUCTION BILATERAL INFERIOR;  Surgeon: Margaretha Sheffield, MD;  Location: [facility] ORS;  Service: ENT;  Laterality: Bilateral;    Prior to Admission medications   Medication Sig Start Date End Date Taking? Authorizing Provider  albuterol (PROVENTIL HFA;VENTOLIN HFA) 108 (90 Base) MCG/ACT inhaler Inhale into the lungs every 6 (six) hours as needed for wheezing or shortness of breath.    [provider]  diclofenac sodium (VOLTAREN) 1 % GEL Apply 4 g topically 4 (four) times daily. 05/22/18   Mikey College, NP  fluticasone (FLONASE) 50 MCG/ACT nasal spray PLACE 2 SPRAYS INTO BOTH NOSTRILS DAILY. 09/18/16   Fisher, Linden Dolin, PA-C  levocetirizine (XYZAL) 5 MG tablet TAKE 1 TABLET (  5 MG TOTAL) BY MOUTH EVERY EVENING. 08/20/16   Fisher, Linden Dolin, PA-C  meloxicam (MOBIC) 15 MG tablet Take 1 tablet (15 mg total) by mouth daily. 05/22/18   Mikey College, NP  metFORMIN (GLUCOPHAGE-XR) 500 MG 24 hr tablet Take 1,000 mg by mouth at bedtime. 06/29/16   [provider]  montelukast (SINGULAIR) 10 MG tablet TAKE 1 TABLET (10 MG TOTAL) BY MOUTH AT BEDTIME. FOR ALLERGIES 04/18/17   Caryn Section Linden Dolin, PA-C    Allergies Sulfa  antibiotics; Bee venom; and Penicillins  Family History  Problem Relation Age of Onset  . Diabetes Mother   . Hypertension Mother   . Heart attack Mother   . Diabetes Father   . Hypertension Father   . Bipolar disorder Father   . Ulcerative colitis Father   . Healthy Brother   . Heart attack Paternal Grandfather     Social History Social History   Tobacco Use  . Smoking status: Never Smoker  . Smokeless tobacco: Never Used  Substance Use Topics  . Alcohol use: Yes    Alcohol/week: 0.0 standard drinks    Comment: occasinally  . Drug use: No    Review of Systems Constitutional: No fever/chills Eyes: No visual changes. ENT: No sore throat. Cardiovascular: Denies chest pain. Respiratory: Denies shortness of breath. Gastrointestinal: Positive for rlq pain. Genitourinary: Negative for dysuria. Musculoskeletal: Negative for back pain. Skin: Negative for rash. Neurological: Negative for headaches, focal weakness or numbness.  ____________________________________________   PHYSICAL EXAM:  VITAL SIGNS: ED Triage Vitals  Enc Vitals Group     BP 06/06/18 1637 (!) 148/91     Pulse Rate 06/06/18 1637 68     Resp 06/06/18 1637 16     Temp 06/06/18 1637 98.2 F (36.8 C)     Temp Source 06/06/18 1637 Oral     SpO2 06/06/18 1637 100 %     Weight 06/06/18 1638 277 lb (125.6 kg)     Height 06/06/18 1638 5' (1.524 m)     Head Circumference --      Peak Flow --      Pain Score 06/06/18 1637 5    Constitutional: Alert and oriented.  Eyes: Conjunctivae are normal.  ENT      Head: Normocephalic and atraumatic.      Nose: No congestion/rhinnorhea.      Mouth/Throat: Mucous membranes are moist.      Neck: No stridor. Hematological/Lymphatic/Immunilogical: No cervical lymphadenopathy. Cardiovascular: Normal rate, regular rhythm.  No murmurs, rubs, or gallops.  Respiratory: Normal respiratory effort without tachypnea nor retractions. Breath sounds are clear and equal  bilaterally. No wheezes/rales/rhonchi. Gastrointestinal: Soft and tender to palpation in the right lower quadrant. Genitourinary: Deferred Musculoskeletal: Normal range of motion in all extremities. No lower extremity edema. Neurologic:  Normal speech and language. No gross focal neurologic deficits are appreciated.  Skin:  Skin is warm, dry and intact. No rash noted. Psychiatric: Mood and affect are normal. Speech and behavior are normal. Patient exhibits appropriate insight and judgment.  ____________________________________________    LABS (pertinent positives/negatives)  CBC wnl CMP wnl Lipase 34 Upreg neg UA not consistent with infection  ____________________________________________   EKG  None  ____________________________________________    RADIOLOGY  CT abd/pel Normal appendicitis. Question enlarged right ovary  US pelvis Normal exam  ____________________________________________   PROCEDURES  Procedures  ____________________________________________   INITIAL IMPRESSION / ASSESSMENT AND PLAN / ED COURSE  Pertinent labs & imaging results that were available during my  care of the patient were reviewed by me and considered in my medical decision making (see chart for details).   Patient presented with rlq pain. ddx would include appendicitis, mesenteric adenitis, kidney stone, UTI, gastroenteritis, ovarian torsion amongst other etiologies. CT abd/pel and US pelvis without obvious etiology. Blood work without concerning leukocytosis. Discussed findings with patient. Discussed return precautions.    ____________________________________________   FINAL CLINICAL IMPRESSION(S) / ED DIAGNOSES  Final diagnoses:  Right adnexal tenderness  Right lower quadrant abdominal pain     Note: This dictation was prepared with Dragon dictation. Any transcriptional errors that result from this process are unintentional     Nance Pear, MD 06/07/18 1500

## 2018-06-09 ENCOUNTER — Other Ambulatory Visit: Payer: Self-pay | Admitting: Certified Nurse Midwife

## 2018-07-21 ENCOUNTER — Ambulatory Visit: Payer: Self-pay | Admitting: Emergency Medicine

## 2018-07-21 VITALS — BP 138/84 | HR 84 | Temp 98.5°F | Resp 16

## 2018-07-21 DIAGNOSIS — J0101 Acute recurrent maxillary sinusitis: Secondary | ICD-10-CM

## 2018-07-21 DIAGNOSIS — J301 Allergic rhinitis due to pollen: Secondary | ICD-10-CM

## 2018-07-21 MED ORDER — MONTELUKAST SODIUM 10 MG PO TABS: 10.0000 mg | ORAL_TABLET | Freq: Every day | ORAL | 3 refills | Status: DC

## 2018-07-21 MED ORDER — PREDNISONE 10 MG PO TABS: ORAL_TABLET | ORAL | 0 refills | Status: DC

## 2018-07-21 NOTE — Patient Instructions (Signed)
Continue doxycycline. Continue Astelin and Flonase. Take Singulair as instructed. Call your ENT doctor for follow-up. Consider allergy shots. Take your taper dose of prednisone as instructed.  Sinusitis, Adult Sinusitis is soreness and inflammation of your sinuses. Sinuses are hollow spaces in the bones around your face. They are located:  Around your eyes.  In the middle of your forehead.  Behind your nose.  In your cheekbones.  Your sinuses and nasal passages are lined with a stringy fluid (mucus). Mucus normally drains out of your sinuses. When your nasal tissues get inflamed or swollen, the mucus can get trapped or blocked so air cannot flow through your sinuses. This lets bacteria, viruses, and funguses grow, and that leads to infection. Follow these instructions at home: Medicines  Take, use, or apply over-the-counter and prescription medicines only as told by your doctor. These may include nasal sprays.  If you were prescribed an antibiotic medicine, take it as told by your doctor. Do not stop taking the antibiotic even if you start to feel better. Hydrate and Humidify  Drink enough water to keep your pee (urine) clear or pale yellow.  Use a cool mist humidifier to keep the humidity level in your home above 50%.  Breathe in steam for 10-15 minutes, 3-4 times a day or as told by your doctor. You can do this in the bathroom while a hot shower is running.  Try not to spend time in cool or dry air. Rest  Rest as much as possible.  Sleep with your head raised (elevated).  Make sure to get enough sleep each night. General instructions  Put a warm, moist washcloth on your face 3-4 times a day or as told by your doctor. This will help with discomfort.  Wash your hands often with soap and water. If there is no soap and water, use hand sanitizer.  Do not smoke. Avoid being around people who are smoking (secondhand smoke).  Keep all follow-up visits as told by your  doctor. This is important. Contact a doctor if:  You have a fever.  Your symptoms get worse.  Your symptoms do not get better within 10 days. Get help right away if:  You have a very bad headache.  You cannot stop throwing up (vomiting).  You have pain or swelling around your face or eyes.  You have trouble seeing.  You feel confused.  Your neck is stiff.  You have trouble breathing. This information is not intended to replace advice given to you by your health care provider. Make sure you discuss any questions you have with your health care provider. Document Released: 02/26/2008 Document Revised: 05/05/2016 Document Reviewed: 07/05/2015 Elsevier Interactive Patient Education  [employer].

## 2018-07-21 NOTE — Progress Notes (Signed)
Subjective. Patient has a history of chronic recurrent sinus infections.  She had sinus surgery in 2017.  She tends to have 2 sinus infections per year.  She is known to have significant allergies especially in the fall.  She was seen 4 days ago at [facility] and started on doxycycline.  She enters today with a persistent frontal headache.  She feels stuffy and that her sinuses are not draining.  She has not felt that antihistamines have been of any help to her.  She currently is on Astelin and Flonase.  She has used a Nettie pot in the past but not recently. Review of systems. She has a history of sleep apnea and uses a CPAP machine. She is on metformin for polycystic ovary syndrome Objective. Alert cooperative in no distress. Eyes conjunctive are not injected. TMs show evidence of previous infection with dullness. Throat status post tonsillectomy. Chest clear to auscultation Assessment. Patient has a history of recurrent sinusitis.  She has a history of allergies.  She is status post sinus surgery 2 years ago.  She has not responded to 4 days of doxycycline. Plan. Continue doxycycline. Add Singulair 10 mg at at bedtime. Continue Flonase and Astelin. Resume Nettie pot. Clean CPAP equipment. Prednisone taper over 6 days.

## 2018-08-06 ENCOUNTER — Ambulatory Visit: Payer: Self-pay | Admitting: Emergency Medicine

## 2018-08-06 VITALS — BP 122/82 | Ht 60.0 in | Wt 301.0 lb

## 2018-08-06 DIAGNOSIS — Z0000 Encounter for general adult medical examination without abnormal findings: Secondary | ICD-10-CM

## 2018-08-06 NOTE — Addendum Note (Signed)
Addended by: Judie Petit on: 08/06/2018 03:33 PM   Modules accepted: Level of Service

## 2018-08-06 NOTE — Progress Notes (Signed)
Subjective. 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 She did live in [place] for 4 years but has had a PPD documented 07/29/2014 as negative. Past medical history Patient has a history of polycystic ovary disease.  She also has a history of sleep apnea.  She has a history of severe allergies recurrent sinus infections. Meds. Finishing a course of Levaquin at present. Uses albuterol inhaler and Astelin nasal spray, and Flonase, and Singulair On Glucophage for PCOS. Objective. Alert cooperative in no distress. Neck supple. Chest clear to auscultation. Heart regular rate and rhythm. Abdomen is obese no masses. Assessment.  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  PPD was not done since it has been done and documented as 07/29/2014. Plan. Forms completed. She does have ENT follow-up for her recurrent sinus infections. PPD not indicated.

## 2018-08-13 ENCOUNTER — Ambulatory Visit: Payer: Self-pay | Admitting: Emergency Medicine

## 2018-08-13 ENCOUNTER — Other Ambulatory Visit: Payer: Self-pay | Admitting: Emergency Medicine

## 2018-08-13 VITALS — BP 128/68 | HR 75 | Temp 98.4°F | Resp 14

## 2018-08-13 DIAGNOSIS — H66004 Acute suppurative otitis media without spontaneous rupture of ear drum, recurrent, right ear: Secondary | ICD-10-CM

## 2018-08-13 DIAGNOSIS — R220 Localized swelling, mass and lump, head: Secondary | ICD-10-CM

## 2018-08-13 DIAGNOSIS — G4733 Obstructive sleep apnea (adult) (pediatric): Secondary | ICD-10-CM

## 2018-08-13 DIAGNOSIS — J0101 Acute recurrent maxillary sinusitis: Secondary | ICD-10-CM

## 2018-08-13 MED ORDER — PREDNISONE 10 MG PO TABS: ORAL_TABLET | ORAL | 0 refills | Status: DC

## 2018-08-13 MED ORDER — CLARITHROMYCIN 500 MG PO TABS: ORAL_TABLET | ORAL | 0 refills | Status: DC

## 2018-08-13 NOTE — Progress Notes (Signed)
Subjective:     Patient ID: Barbara Horn, female   DOB: 07/30/1981, 37 y.o.   MRN: [id number] Acute care visit.-  [facility]  Chief complaint: Severely worsening sinus symptoms  HPI Great deal of time was taken to interview patient and review records.  All this started 5 to 6 weeks ago, but complains of 10 days of severely worsening sinus symptoms, including bilateral maxillary pain and swelling and bilateral frontal pain, feeling sinus congestion, discolored rhinorrhea.  No nosebleeds, but occasionally can have scant blood with mucoid nasal discharge. Associated symptoms: Right ear pressure and pain without drainage.  Denies sore throat.  May have had a low-grade fever, not documented. She has a complicated, chronic history of recurrent sinus infections. Had endoscopic sinus surgery and septoplasty Dr Charolett Bumpers 07/2016.  Patient states she tried to get an appointment ASAP with Dr Charolett Bumpers, but soonest appointment they had available was made, for 08/25/2018 and afternoon  Her PCP is Cassell Smiles at [facility], only prior visit there was 05/22/2018 to establish care.  Patient has appointment for physical/preventive care visit there 08/25/2018 a.m.  Over the past month or so,  has had worsening sinus symptoms, and has seen multiple providers, including: 07/18/2018-seen by Dr. Fulton Mole at [facility].  Prescribed doxycycline and Astelin. 07/21/2018-seen by Dr. Everlene Farrier at here  [facility].  Singulair was added, and advised to continue with doxycycline, Flonase, and Astelin and prescribed prednisone 6-day taper orally. 07/27/2018, saw Dr.Scott Duanne Moron at [facility].  Prescribed 14-day course of Levaquin, added Tessalon, and prescribed prednisone 20 mg twice daily. 08/06/2018-had a very brief visit  here with Dr. 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  Other pertinent PMH: -Has OSA, uses CPAP, but  is having trouble fitting CPAP machine/mask.  She will be following up with specialist and/or PCP to help her improve her CPAP mask. -PCOS -Prediabetes -History of ADHD diagnosis in the past, not currently on medication -Allergic to penicillin and sulfa  Review of Systems  Constitutional: Positive for fatigue and fever (Low-grade). Negative for chills.  HENT: Positive for congestion, ear pain (Right), facial swelling, rhinorrhea, sinus pressure, sinus pain and voice change. Negative for dental problem, ear discharge, sore throat and trouble swallowing.   Eyes: Negative.   Respiratory: Negative.  Negative for shortness of breath.   Cardiovascular: Negative.  Negative for chest pain, palpitations and leg swelling.  Gastrointestinal: Negative.   Genitourinary: Negative for difficulty urinating.  Musculoskeletal: Positive for myalgias.  Skin: Negative for rash and wound.  Neurological: Positive for headaches. Negative for seizures, syncope, facial asymmetry, speech difficulty, light-headedness and numbness.       No focal weakness  Psychiatric/Behavioral: Negative for hallucinations.  All other systems reviewed and are negative.      Objective:       Physical Exam  Constitutional: She is oriented to person, place, and time. She appears well-developed and well-nourished.  Overweight.  Sounds nasally congested, appears uncomfortable from facial pain and congestion.  No acute cardiorespiratory distress.   Head: Atraumatic.  Bilateral fullness maxillary areas without redness or heat or warmth or skin change.  Tender to palpation maxillary and frontal sinus areas. Right Ear: TM mildly red, mildly bulging but intact.  Mild air-fluid levels.  External canal patent, without drainage or bleeding or abnormality. Left Ear: Tympanic membrane, external ear and ear canal normal.  Minimal serous air-fluid level but otherwise TM normal.  No redness.  Nose: Mucosal edema and mucoid rhinorrhea present.  No  bleeding.  No foreign body seen. Mouth/Throat: Oropharynx is clear and moist. No oral lesions. No oropharyngeal exudate.  Eyes: Right eye exhibits no discharge. Left eye exhibits no discharge. No scleral icterus.  Neck: Neck supple. No adenopathy Cardiovascular: Normal rate, regular rhythm and normal heart sounds.  Pulmonary/Chest: Effort normal and breath sounds normal. She has no wheezes. She has no rales.  Neurological: She is alert and oriented to person, place, and time. No focal abnormalities.  Cranial nerves intact. Skin: Skin is warm and dry. No rash Nursing note and vitals reviewed.   Assessment:     5 to 6 weeks of worsening sinusitis symptoms.  See discussion above in history. She likely has partially treated recurrent maxillary and frontal sinusitis, likely bacterial and inflammatory components, and still is very symptomatic with worsening symptoms.   Other causes possible, and I feel she needs CT of sinuses and full ENT evaluation with Dr Charolett Bumpers her ENT, appointment there 08/25/2018. She also needs to see her PCP for evaluation of multiple chronic problems,(PCOS, prediabetes, ADHD), and  particularly orders to evaluate and to refit and adjust her CPAP mask, which could be a factor in her sinus symptoms.     Plan:     Treatment options discussed, as well as risks, benefits, alternatives. Patient voiced understanding and agreement with the following plans: New Prescriptions   CLARITHROMYCIN (BIAXIN) 500 MG TABLET    Take 1 twice a day for 10 days.   PREDNISONE (DELTASONE) 10 MG TABLET    Days 1 & 2, take 6 daily. Days 3 & 4, take 5 daily. Days 5 & 6: 4 daily. Days 7 &8: 3 daily. Days 9 &10: 2 daily. Days 11 &12: 1 daily.   (Of note, she is allergic to penicillin and sulfa). (she's all ready been treated elsewhere with courses of doxycycline and Levaquin) I have ordered CT sinuses without contrast.-(Before ordering, I discussed with Dr. Carlis Abbott, radiologist, who based on patient's  history, advised CT sinuses without contrast) Follow-up ASAP Dr Charolett Bumpers her ENT, appointment there 08/25/2018 in afternoon. Keep appointment (for annual physical) with Cassell Smiles, her PCP, 08/25/2018 at 9 AM.-I strongly suggested that patient make a separate appointment within the next week to follow-up with Cassell Smiles to review/treat some of her other chronic medical problems and get order for refitting CPAP mask.  An After Visit Summary was printed and given to the patient. Precautions discussed.  Verbal instructions and AVS given to patient. Questions invited and answered. She voiced understanding and agreement with above

## 2018-08-13 NOTE — Patient Instructions (Signed)
You have subacute/chronic recurrent sinusitis and right ear infection. Prescribing a 12-day course of prednisone Dosepak and Biaxin twice a day for 10 days.-Prescriptions sent to your pharmacy.  Stop using Astelin. Continue Flonase, but stop using if you get nosebleeds. Continue Singulair. I am ordering CT of sinuses, and your ENT can review results of that when you go to your ENT appointment. Keep appointment with your ENT 08/25/2018 p.m. Keep appointment with your PCP for wellness exam on 08/25/2018 a.m.-I also advised you get a sooner appointment next week for follow-up on medical problems, such as CPAP issues and ADHD medication issues    Sinusitis is soreness and inflammation of your sinuses. Sinuses are hollow spaces in the bones around your face. Your sinuses are located:  Around your eyes.  In the middle of your forehead.  Behind your nose.  In your cheekbones.  Your sinuses and nasal passages are lined with a stringy fluid (mucus). Mucus normally drains out of your sinuses. When your nasal tissues become inflamed or swollen, the mucus can become trapped or blocked so air cannot flow through your sinuses. This allows bacteria, viruses, and funguses to grow, which leads to infection. Sinusitis can develop quickly and last for 7?10 days (acute) or for more than 12 weeks (chronic). Sinusitis often develops after a cold. What are the causes? This condition is caused by anything that creates swelling in the sinuses or stops mucus from draining, including:  Allergies.  Asthma.  Bacterial or viral infection.  Abnormally shaped bones between the nasal passages.  Nasal growths that contain mucus (nasal polyps).  Narrow sinus openings.  Pollutants, such as chemicals or irritants in the air.  A foreign object stuck in the nose.  A fungal infection. This is rare.  What increases the risk? The following factors may make you more likely to develop this condition:  Having  allergies or asthma.  Having had a recent cold or respiratory tract infection.  Having structural deformities or blockages in your nose or sinuses.  Having a weak immune system.  Doing a lot of swimming or diving.  Overusing nasal sprays.  Smoking.  What are the signs or symptoms? The main symptoms of this condition are pain and a feeling of pressure around the affected sinuses. Other symptoms include:  Upper toothache.  Earache.  Headache.  Bad breath.  Decreased sense of smell and taste.  A cough that may get worse at night.  Fatigue.  Fever.  Thick drainage from your nose. The drainage is often green and it may contain pus (purulent).  Stuffy nose or congestion.  Postnasal drip. This is when extra mucus collects in the throat or back of the nose.  Swelling and warmth over the affected sinuses.  Sore throat.  Sensitivity to light.  How is this diagnosed? This condition is diagnosed based on symptoms, a medical history, and a physical exam. To find out if your condition is acute or chronic, your health care provider may:  Look in your nose for signs of nasal polyps.  Tap over the affected sinus to check for signs of infection.  View the inside of your sinuses using an imaging device that has a light attached (endoscope).  If your health care provider suspects that you have chronic sinusitis, you may also:  Be tested for allergies.  Have a sample of mucus taken from your nose (nasal culture) and checked for bacteria.  Have a mucus sample examined to see if your sinusitis is related to an  allergy.  If your sinusitis does not respond to treatment and it lasts longer than 8 weeks, you may have an MRI or CT scan to check your sinuses. These scans also help to determine how severe your infection is. In rare cases, a bone biopsy may be done to rule out more serious types of fungal sinus disease. How is this treated? Treatment for sinusitis depends on the  cause and whether your condition is chronic or acute. If a virus is causing your sinusitis, your symptoms will go away on their own within 10 days. You may be given medicines to relieve your symptoms, including:  Topical nasal decongestants. They shrink swollen nasal passages and let mucus drain from your sinuses.  Antihistamines. These drugs block inflammation that is triggered by allergies. This can help to ease swelling in your nose and sinuses.  Topical nasal corticosteroids. These are nasal sprays that ease inflammation and swelling in your nose and sinuses.  Nasal saline washes. These rinses can help to get rid of thick mucus in your nose.  If your condition is caused by bacteria, you will be given an antibiotic medicine. If your condition is caused by a fungus, you will be given an antifungal medicine. Surgery may be needed to correct underlying conditions, such as narrow nasal passages. Surgery may also be needed to remove polyps. Follow these instructions at home: Medicines  Take, use, or apply over-the-counter and prescription medicines only as told by your health care provider. These may include nasal sprays.  If you were prescribed an antibiotic medicine, take it as told by your health care provider. Do not stop taking the antibiotic even if you start to feel better. Hydrate and Humidify  Drink enough water to keep your urine clear or pale yellow. Staying hydrated will help to thin your mucus.  Use a cool mist humidifier to keep the humidity level in your home above 50%.  Inhale steam for 10-15 minutes, 3-4 times a day or as told by your health care provider. You can do this in the bathroom while a hot shower is running.  Limit your exposure to cool or dry air. Rest  Rest as much as possible.  Sleep with your head raised (elevated).  Make sure to get enough sleep each night. General instructions  Apply a warm, moist washcloth to your face 3-4 times a day or as told by  your health care provider. This will help with discomfort.  Wash your hands often with soap and water to reduce your exposure to viruses and other germs. If soap and water are not available, use hand sanitizer.  Do not smoke. Avoid being around people who are smoking (secondhand smoke).  Keep all follow-up visits as told by your health care provider. This is important. Contact a health care provider if:  You have a fever.  Your symptoms get worse.  Your symptoms do not improve within 10 days. Get help right away if:  You have a severe headache.  You have persistent vomiting.  You have pain or swelling around your face or eyes.  You have vision problems.  You develop confusion.  Your neck is stiff.  You have trouble breathing. This information is not intended to replace advice given to you by your health care provider. Make sure you discuss any questions you have with your health care provider. DocumIf your health care provider suspects that you have chronic sinusitis, you may also:  Be tested for allergies.  Have a sample of  mucus taken from your nose (nasal culture) and checked for bacteria.  Have a mucus sample examined to see if your sinusitis is related to an allergy.  If your sinusitis does not respond to treatment and it lasts longer than 8 weeks, you may have an MRI or CT scan to check your sinuses. These scans also help to determine how severe your infection is. In rare cases, a bone biopsy may be done to rule out more serious types of fungal sinus disease. How is this treated? Treatment for sinusitis depends on the cause and whether your condition is chronic or acute. If a virus is causing your sinusitis, your symptoms will go away on their own within 10 days. You may be given medicines to relieve your symptoms, including:  Topical nasal decongestants. They shrink swollen nasal passages and let mucus drain from your sinuses.  Antihistamines. These drugs block  inflammation that is triggered by allergies. This can help to ease swelling in your nose and sinuses.  Topical nasal corticosteroids. These are nasal sprays that ease inflammation and swelling in your nose and sinuses.  Nasal saline washes. These rinses can help to get rid of thick mucus in your nose.  If your condition is caused by bacteria, you will be given an antibiotic medicine. If your condition is caused by a fungus, you will be given an antifungal medicine. Surgery may be needed to correct underlying conditions, such as narrow nasal passages. Surgery may also be needed to remove polyps. Follow these instructions at home: Medicines  Take, use, or apply over-the-counter and prescription medicines only as told by your health care provider. These may include nasal sprays.  If you were prescribed an antibiotic medicine, take it as told by your health care provider. Do not stop taking the antibiotic even if you start to feel better. Hydrate and Humidify  Drink enough water to keep your urine clear or pale yellow. Staying hydrated will help to thin your mucus.  Use a cool mist humidifier to keep the humidity level in your home above 50%.  Inhale steam for 10-15 minutes, 3-4 times a day or as told by your health care provider. You can do this in the bathroom while a hot shower is running.  Limit your exposure to cool or dry air. Rest  Rest as much as possible.  Sleep with your head raised (elevated).  Make sure to get enough sleep each night. General instructions  Apply a warm, moist washcloth to your face 3-4 times a day or as told by your health care provider. This will help with discomfort.  Wash your hands often with soap and water to reduce your exposure to viruses and other germs. If soap and water are not available, use hand sanitizer.  Do not smoke. Avoid being around people who are smoking (secondhand smoke).  Keep all follow-up visits as told by your health care  provider. This is important. Contact a health care provider if:  You have a fever.  Your symptoms get worse.  Your symptoms do not improve within 10 days. Get help right away if:  You have a severe headache.  You have persistent vomiting.  You have pain or swelling around your face or eyes.  You have vision problems.  You develop confusion.  Your neck is stiff.  You have trouble breathing.

## 2018-08-25 ENCOUNTER — Ambulatory Visit (INDEPENDENT_AMBULATORY_CARE_PROVIDER_SITE_OTHER): Payer: Managed Care, Other (non HMO) | Admitting: Nurse Practitioner

## 2018-08-25 ENCOUNTER — Encounter: Payer: Self-pay | Admitting: Nurse Practitioner

## 2018-08-25 ENCOUNTER — Other Ambulatory Visit: Payer: Self-pay

## 2018-08-25 ENCOUNTER — Other Ambulatory Visit (HOSPITAL_COMMUNITY)
Admission: RE | Admit: 2018-08-25 | Discharge: 2018-08-25 | Disposition: A | Discharge status: Home / Self Care | Payer: Managed Care, Other (non HMO) | Source: Ambulatory Visit | Attending: Nurse Practitioner | Admitting: Nurse Practitioner

## 2018-08-25 VITALS — BP 142/76 | HR 62 | Temp 98.3°F | Resp 17 | Ht 60.0 in | Wt 300.2 lb

## 2018-08-25 DIAGNOSIS — Z124 Encounter for screening for malignant neoplasm of cervix: Secondary | ICD-10-CM

## 2018-08-25 DIAGNOSIS — Z0000 Encounter for general adult medical examination without abnormal findings: Secondary | ICD-10-CM

## 2018-08-25 DIAGNOSIS — Z114 Encounter for screening for human immunodeficiency virus [HIV]: Secondary | ICD-10-CM

## 2018-08-25 NOTE — Progress Notes (Signed)
Subjective:    Patient ID: Barbara Horn, female    DOB: 1980-09-25, 37 y.o.   MRN: [id number]  Barbara Horn is a 37 y.o. female presenting on 08/25/2018 for Annual Exam (recurrent sinus infection. Pt reports shes been on 3 rounds of abx & steroids since October. She currently have an appt schedule with [facility])   HPI Annual Physical Exam Patient has been feeling well except for sinus recurrence infections.  They have no acute concerns today. Sleeps 7-8 hours per night uninterrupted.  HEALTH MAINTENANCE: Weight/BMI: morbid obesity, gained 30 lbs since september Physical activity: regular Diet: low carb eating for weight loss Seatbelt: always Sunscreen: not regular except for makeup PAP: due - last 2016 HIV: never - agrees to do today Optometry: appointment today Dentistry: regular  VACCINES: Tetanus: done 2017 Influenza: due - declines  Past Medical History:  Diagnosis Date  . ADHD   . Allergy   . Arthritis   . Complication of anesthesia    WHEEZING AFTER GALLBLADDER SURGERY IN MAY 2017  . Noninflammatory disorder of ovary, fallopian tube, and broad ligament 07/05/2015  . PCOS (polycystic ovarian syndrome)   . Plantar fasciitis, bilateral   . Seasonal allergies   . Sleep apnea    cpap   Past Surgical History:  Procedure Laterality Date  . CHOLECYSTECTOMY N/A 01/31/2016   Procedure: LAPAROSCOPIC CHOLECYSTECTOMY;  Surgeon: Jules Husbands, MD;  Location: [facility];  Service: General;  Laterality: N/A;  . ENDOSCOPIC CONCHA BULLOSA RESECTION Bilateral 08/05/2016   Procedure: ENDOSCOPIC CONCHA BULLOSA RESECTION;  Surgeon: Margaretha Sheffield, MD;  Location: [facility];  Service: ENT;  Laterality: Bilateral;  . FOOT SURGERY     x2  . IMAGE GUIDED SINUS SURGERY Bilateral 08/05/2016   Procedure: IMAGE GUIDED SINUS SURGERY;  Surgeon: Margaretha Sheffield, MD;  Location: [facility];  Service: ENT;  Laterality: Bilateral;  . MAXILLARY ANTROSTOMY Right 08/05/2016   Procedure: MAXILLARY  ANTROSTOMY WITH TISSUE REMOVAL;  Surgeon: Margaretha Sheffield, MD;  Location: [facility];  Service: ENT;  Laterality: Right;  . MOUTH SURGERY    . SEPTOPLASTY N/A 08/05/2016   Procedure: SEPTOPLASTY;  Surgeon: Margaretha Sheffield, MD;  Location: [facility];  Service: ENT;  Laterality: N/A;  . SPHENOIDECTOMY Left 08/05/2016   Procedure: Coralee Pesa;  Surgeon: Margaretha Sheffield, MD;  Location: [facility];  Service: ENT;  Laterality: Left;  . TONSILLECTOMY    . TURBINATE REDUCTION Bilateral 08/05/2016   Procedure: TURBINATE REDUCTION BILATERAL INFERIOR;  Surgeon: Margaretha Sheffield, MD;  Location: [facility];  Service: ENT;  Laterality: Bilateral;   Social History   Socioeconomic History  . Marital status: Married    Spouse name: Not on file  . Number of children: 2  . Years of education: Not on file  . Highest education level: Associate degree: occupational, Hotel manager, or vocational program  Occupational History  . Not on file  Social Needs  . Financial resource strain: Not on file  . Food insecurity:    Worry: Not on file    Inability: Not on file  . Transportation needs:    Medical: Not on file    Non-medical: Not on file  Tobacco Use  . Smoking status: Never Smoker  . Smokeless tobacco: Never Used  Substance and Sexual Activity  . Alcohol use: Yes    Alcohol/week: 0.0 standard drinks    Comment: occasinally  . Drug use: No  . Sexual activity: Yes    Birth control/protection: Pill  Lifestyle  . Physical activity:  Days per week: 2 days    Minutes per session: 20 min  . Stress: Not on file  Relationships  . Social connections:    Talks on phone: Not on file    Gets together: Not on file    Attends religious service: Not on file    Active member of club or organization: Not on file    Attends meetings of clubs or organizations: Not on file    Relationship status: Not on file  . Intimate partner violence:    Fear of current or ex partner: Not on file    Emotionally abused: Not on file     Physically abused: Not on file    Forced sexual activity: Not on file  Other Topics Concern  . Not on file  Social History Narrative  . Not on file   Family History  Problem Relation Age of Onset  . Diabetes Mother   . Hypertension Mother   . Heart attack Mother   . Diabetes Father   . Hypertension Father   . Bipolar disorder Father   . Ulcerative colitis Father   . Healthy Brother   . Heart attack Paternal Grandfather    Current Outpatient Medications on File Prior to Visit  Medication Sig  . albuterol (PROVENTIL HFA;VENTOLIN HFA) 108 (90 Base) MCG/ACT inhaler Inhale into the lungs every 6 (six) hours as needed for wheezing or shortness of breath.  . diclofenac sodium (VOLTAREN) 1 % GEL Apply 4 g topically 4 (four) times daily.  . fluticasone (FLONASE) 50 MCG/ACT nasal spray PLACE 2 SPRAYS INTO BOTH NOSTRILS DAILY.  . meloxicam (MOBIC) 15 MG tablet Take 1 tablet (15 mg total) by mouth daily.  . metFORMIN (GLUCOPHAGE-XR) 500 MG 24 hr tablet Take 1,000 mg by mouth at bedtime.  . montelukast (SINGULAIR) 10 MG tablet Take 1 tablet (10 mg total) by mouth at bedtime.  . clarithromycin (BIAXIN) 500 MG tablet Take 1 twice a day for 10 days. (Patient not taking: Reported on 08/25/2018)  . EPINEPHrine 0.3 mg/0.3 mL IJ SOAJ injection INJECT 0.3 MLS (0.3 MG TOTAL) INTO THE MUSCLE ONCE FOR 1 DOSE.  Marland Kitchen levofloxacin (LEVAQUIN) 500 MG tablet Take 500 mg by mouth daily.  Marland Kitchen LILLOW 0.15-30 MG-MCG tablet TAKE 1 TABLET BY MOUTH DAILY. TAKE ACTIVE PILL DAILY FOR 6 WEEKS THEN SKIP 1 WEEK AND REPEAT CYCLE.   No current facility-administered medications on file prior to visit.     Review of Systems  Constitutional: Negative for chills and fever.  HENT: Positive for congestion and sinus pressure. Negative for sore throat.   Eyes: Negative for pain.  Respiratory: Negative for cough, shortness of breath and wheezing.   Cardiovascular: Negative for chest pain, palpitations and leg swelling.    Gastrointestinal: Negative for abdominal pain, blood in stool, constipation, diarrhea, nausea and vomiting.  Endocrine: Negative for polydipsia.  Genitourinary: Negative for dysuria, frequency, hematuria and urgency.  Musculoskeletal: Negative for back pain, myalgias and neck pain.  Skin: Negative.  Negative for rash.  Allergic/Immunologic: Negative for environmental allergies.  Neurological: Negative for dizziness, weakness and headaches.  Hematological: Does not bruise/bleed easily.  Psychiatric/Behavioral: Negative for dysphoric mood and suicidal ideas. The patient is not nervous/anxious.    Per HPI unless specifically indicated above     Objective:    BP (!) 142/76 (BP Location: Left Arm, Patient Position: Sitting, Cuff Size: Large)   Pulse 62   Temp 98.3 F (36.8 C) (Oral)   Resp 17   Ht  5' (1.524 m)   Wt (!) 300 lb 3.2 oz (136.2 kg)   LMP 08/04/2018   SpO2 100%   BMI 58.63 kg/m   Wt Readings from Last 3 Encounters:  08/25/18 (!) 300 lb 3.2 oz (136.2 kg)  08/06/18 (!) 301 lb (136.5 kg)  06/06/18 277 lb (125.6 kg)    Physical Exam  Constitutional: She is oriented to person, place, and time. She appears well-developed and well-nourished. No distress.  HENT:  Head: Normocephalic and atraumatic.  Right Ear: External ear normal.  Left Ear: External ear normal.  Nose: Nose normal.  Mouth/Throat: Oropharynx is clear and moist.  Eyes: Pupils are equal, round, and reactive to light. Conjunctivae are normal.  Neck: Normal range of motion. Neck supple. No JVD present. No tracheal deviation present. No thyromegaly present.  Cardiovascular: Normal rate, regular rhythm, normal heart sounds and intact distal pulses. Exam reveals no gallop and no friction rub.  No murmur heard. Pulmonary/Chest: Effort normal and breath sounds normal. No respiratory distress.  Breast - Normal exam w/ symmetric breasts, no nipple discharge, no skin changes or tenderness.   - Mass as noted on image.     Abdominal: Soft. Bowel sounds are normal. She exhibits no distension. There is no hepatosplenomegaly. There is no tenderness.  Genitourinary:  Genitourinary Comments: Normal external female genitalia without lesions or fusion. Vaginal canal without lesions. Normal appearing cervix without lesions or friability. Physiologic discharge on exam. Bimanual exam without adnexal masses, enlarged uterus, or cervical motion tenderness.  Musculoskeletal: Normal range of motion.  Lymphadenopathy:    She has no cervical adenopathy.  Neurological: She is alert and oriented to person, place, and time. No cranial nerve deficit.  Skin: Skin is warm and dry.  Psychiatric: She has a normal mood and affect. Her behavior is normal. Judgment and thought content normal.  Nursing note and vitals reviewed.  Results for orders placed or performed during the hospital encounter of 06/06/18  Lipase, blood  Result Value Ref Range   Lipase 34 11 - 51 U/L  Comprehensive metabolic panel  Result Value Ref Range   Sodium 140 135 - 145 mmol/L   Potassium 3.8 3.5 - 5.1 mmol/L   Chloride 108 98 - 111 mmol/L   CO2 25 22 - 32 mmol/L   Glucose, Bld 83 70 - 99 mg/dL   BUN 14 6 - 20 mg/dL   Creatinine, Ser 0.57 0.44 - 1.00 mg/dL   Calcium 9.3 8.9 - 10.3 mg/dL   Total Protein 7.9 6.5 - 8.1 g/dL   Albumin 4.5 3.5 - 5.0 g/dL   AST 19 15 - 41 U/L   ALT 18 0 - 44 U/L   Alkaline Phosphatase 48 38 - 126 U/L   Total Bilirubin 0.5 0.3 - 1.2 mg/dL   GFR calc non Af Amer >60 >60 mL/min   GFR calc Af Amer >60 >60 mL/min   Anion gap 7 5 - 15  CBC  Result Value Ref Range   WBC 7.9 3.6 - 11.0 K/uL   RBC 4.93 3.80 - 5.20 MIL/uL   Hemoglobin 14.4 12.0 - 16.0 g/dL   HCT 42.1 35.0 - 47.0 %   MCV 85.5 80.0 - 100.0 fL   MCH 29.2 26.0 - 34.0 pg   MCHC 34.1 32.0 - 36.0 g/dL   RDW 14.2 11.5 - 14.5 %   Platelets 200 150 - 440 K/uL  Urinalysis, Complete w Microscopic  Result Value Ref Range   Color, Urine YELLOW (A) YELLOW  APPearance CLEAR (A) CLEAR   Specific Gravity, Urine 1.018 1.005 - 1.030   pH 7.0 5.0 - 8.0   Glucose, UA NEGATIVE NEGATIVE mg/dL   Hgb urine dipstick NEGATIVE NEGATIVE   Bilirubin Urine NEGATIVE NEGATIVE   Ketones, ur NEGATIVE NEGATIVE mg/dL   Protein, ur NEGATIVE NEGATIVE mg/dL   Nitrite NEGATIVE NEGATIVE   Leukocytes, UA NEGATIVE NEGATIVE   WBC, UA 0-5 0 - 5 WBC/hpf   Bacteria, UA NONE SEEN NONE SEEN   Squamous Epithelial / LPF 0-5 0 - 5   Mucus PRESENT   Pregnancy, urine POC  Result Value Ref Range   Preg Test, Ur NEGATIVE NEGATIVE      Assessment & Plan:   Problem List Items Addressed This Visit    None    Visit Diagnoses    Encounter for annual physical exam    -  Primary   Relevant Orders   HIV Antibody (routine testing w rflx)   TSH   Lipid panel   COMPLETE METABOLIC PANEL WITH GFR   Hemoglobin A1c   CBC with Differential/Platelet   Cytology - PAP   Encounter for Papanicolaou smear for cervical cancer screening       Relevant Orders   Cytology - PAP   Screening for HIV without presence of risk factors       Relevant Orders   HIV Antibody (routine testing w rflx)      Physical exam with no new findings.  Well adult with no acute concerns.  Plan: 1. Obtain health maintenance screenings as above according to age. - Increase physical activity to 30 minutes most days of the week.  - Eat healthy diet high in vegetables and fruits; low in refined carbohydrates. - labs as ordered above - Pap for cervical cancer screen. 2. Return 1 year for annual physical.    Follow up plan: Return in about 1 year (around 08/26/2019) for annual physical.  Cassell Smiles, DNP, AGPCNP-BC Adult Gerontology Primary Care Nurse Practitioner [facility] 08/26/2018, 2:54 PM

## 2018-08-25 NOTE — Patient Instructions (Signed)
[facility],   Thank you for coming in to clinic today.  1. Continue weight loss efforts.  - Continue low carb diet - Continue regular physical activity - Focus on stress management so you can avoid changing patterns of eating with stress.  2. Call clinic if you have not heard about your sleep study referral in about 2 weeks.  Please schedule a follow-up appointment with Cassell Smiles, AGNP. Return in about 1 year (around 08/26/2019) for annual physical.  If you have any other questions or concerns, please feel free to call the clinic or send a message through [place]. You may also schedule an earlier appointment if necessary.  You will receive a survey after today's visit either digitally by e-mail or paper by [employer]. Your experiences and feedback matter to [place].  Please respond so we know how we are doing as we provide care for you.   Cassell Smiles, DNP, AGNP-BC Adult Gerontology Nurse Practitioner [place]

## 2018-08-26 ENCOUNTER — Encounter: Payer: Self-pay | Admitting: Nurse Practitioner

## 2018-08-27 LAB — CYTOLOGY - PAP
Chlamydia: NEGATIVE
Diagnosis: UNDETERMINED — AB
HPV: NOT DETECTED
Neisseria Gonorrhea: NEGATIVE

## 2018-08-28 ENCOUNTER — Other Ambulatory Visit: Payer: Managed Care, Other (non HMO)

## 2018-09-02 ENCOUNTER — Telehealth: Payer: Self-pay | Admitting: Nurse Practitioner

## 2018-09-02 DIAGNOSIS — R29818 Other symptoms and signs involving the nervous system: Secondary | ICD-10-CM

## 2018-09-02 NOTE — Telephone Encounter (Signed)
-----   Message from Wilson Singer, [place] sent at 08/26/2018  4:24 PM EST ----- Regarding: FW: Fu on CPAP titration study I called [facility] to f/u if they received the initial referral. They could not locate the referral Please resubmit.     ----- Message ----- From: Mikey College, NP Sent: 08/25/2018   1:47 PM EST To: Wilson Singer, CMA, Jeanelle Malling, CMA Subject: FW: Fu on CPAP titration study                 Can one of you call [facility] and see if they received her initial referral?  If needing a new referral, I can send one through epic.  She needs CPAP titration study.  Zykeria Laguardia ----- Message ----- From: Mikey College, NP Sent: 08/25/2018   8:49 AM EST To: Mikey College, NP Subject: Fu on CPAP titration study

## 2018-09-02 NOTE — Telephone Encounter (Signed)
Please resubmit sleep study referral.  Use OV notes from 05/28/2018 and 08/25/2018

## 2018-09-22 ENCOUNTER — Ambulatory Visit: Payer: Self-pay | Admitting: Emergency Medicine

## 2018-09-22 ENCOUNTER — Encounter: Payer: Self-pay | Admitting: Emergency Medicine

## 2018-09-22 VITALS — BP 122/84 | HR 78 | Temp 98.3°F | Resp 16

## 2018-09-22 DIAGNOSIS — H109 Unspecified conjunctivitis: Secondary | ICD-10-CM

## 2018-09-22 MED ORDER — CIPROFLOXACIN HCL 0.3 % OP SOLN: OPHTHALMIC | 0 refills | Status: DC

## 2018-09-22 NOTE — Progress Notes (Signed)
[facility]   Patient ID: Barbara Horn DOB: 37 y.o. MRN: [id number]   Subjective: 1 day of progressively worsening bilateral eye redness and irritation with yellow matted eye discharge this morning.  No history of trauma or suspicion of foreign body.  She works in [employer].  No fever or chills or nausea or vomiting or other HEENT symptoms. No discolored rhinorrhea.    Objective: Blood pressure 122/84, pulse 78, temperature 98.3 F (36.8 C), temperature source Oral, resp. rate 16, SpO2 97 %.  In general, no distress. Both eyes red, inflamed conjunctiva with slight yellow matted discharge, worse on the left.  No cellulitis or signs of infection of eyelids.  No foreign body seen.  Vision grossly intact.  EOMI, [place].  Anterior chamber within normal limits.  No sign of facial cellulitis or tenderness over maxillary area or orbital area.   Assessment: Likely bacterial conjunctivitis bilaterally    Plan:  Treatment options discussed, as well as risks, benefits, alternatives. Various symptomatic care discussed for conjunctivitis and discussed contagiousness and other precautions. Discussed and researched antibiotic eyedrop options.  She is allergic to sulfa which caused anaphylaxis, and reviewing her history, it is likely sulfa allergy, but it is possible it could have been in combination with trimethoprim as an oral antibiotic in the past, so I am avoiding trimethoprim antibiotic eyedrop to be on the safe side.  Also reviewed gentamicin caused itching and penicillins associated with rash. I researched on up-to-date software, suggested option is Cipro ophthalmic drops. Patient voiced understanding and agreement with the following plans:  New Prescriptions   CIPROFLOXACIN (CILOXAN) 0.3 % OPHTHALMIC SOLUTION    2 drops in affected eye(s) 4 times a day x 7 days      Follow-up with your eye doctor in 3-5 days if not improving, or sooner if symptoms  become worse. Precautions discussed. Red flags discussed. Questions invited and answered. Patient voiced understanding and agreement.

## 2018-11-12 ENCOUNTER — Ambulatory Visit: Payer: Self-pay | Admitting: Adult Health

## 2018-11-12 VITALS — BP 160/92 | HR 70 | Resp 16

## 2018-11-12 DIAGNOSIS — H66004 Acute suppurative otitis media without spontaneous rupture of ear drum, recurrent, right ear: Secondary | ICD-10-CM

## 2018-11-12 DIAGNOSIS — R030 Elevated blood-pressure reading, without diagnosis of hypertension: Secondary | ICD-10-CM

## 2018-11-12 NOTE — Patient Instructions (Signed)
Preventing Hypertension Hypertension, commonly called high blood pressure, is when the force of blood pumping through the arteries is too strong. Arteries are blood vessels that carry blood from the heart throughout the body. Over time, hypertension can damage the arteries and decrease blood flow to important parts of the body, including the brain, heart, and kidneys. Often, hypertension does not cause symptoms until blood pressure is very high. For this reason, it is important to have your blood pressure checked on a regular basis. Hypertension can often be prevented with diet and lifestyle changes. If you already have hypertension, you can control it with diet and lifestyle changes, as well as medicine. What nutrition changes can be made? Maintain a healthy diet. This includes:  Eating less salt (sodium). Ask your health care provider how much sodium is safe for you to have. The general recommendation is to consume less than 1 tsp (2,300 mg) of sodium a day. ? Do not add salt to your food. ? Choose low-sodium options when grocery shopping and eating out.  Limiting fats in your diet. You can do this by eating low-fat or fat-free dairy products and by eating less red meat.  Eating more fruits, vegetables, and whole grains. Make a goal to eat: ? 1-2 cups of fresh fruits and vegetables each day. ? 3-4 servings of whole grains each day.  Avoiding foods and beverages that have added sugars.  Eating fish that contain healthy fats (omega-3 fatty acids), such as mackerel or salmon. If you need help putting together a healthy eating plan, try the DASH diet. This diet is high in fruits, vegetables, and whole grains. It is low in sodium, red meat, and added sugars. DASH stands for Dietary Approaches to Stop Hypertension. What lifestyle changes can be made?   Lose weight if you are overweight. Losing just 3?5% of your body weight can help prevent or control hypertension. ? For example, if your present  weight is 200 lb (91 kg), a loss of 3-5% of your weight means losing 6-10 lb (2.7-4.5 kg). ? Ask your health care provider to help you with a diet and exercise plan to safely lose weight.  Get enough exercise. Do at least 150 minutes of moderate-intensity exercise each week. ? You could do this in short exercise sessions several times a day, or you could do longer exercise sessions a few times a week. For example, you could take a brisk 10-minute walk or bike ride, 3 times a day, for 5 days a week.  Find ways to reduce stress, such as exercising, meditating, listening to music, or taking a yoga class. If you need help reducing stress, ask your health care provider.  Do not smoke. This includes e-cigarettes. Chemicals in tobacco and nicotine products raise your blood pressure each time you smoke. If you need help quitting, ask your health care provider.  Avoid alcohol. If you drink alcohol, limit alcohol intake to no more than 1 drink a day for nonpregnant women and 2 drinks a day for men. One drink equals 12 oz of beer, 5 oz of wine, or 1 oz of hard liquor. Why are these changes important? Diet and lifestyle changes can help you prevent hypertension, and they may make you feel better overall and improve your quality of life. If you have hypertension, making these changes will help you control it and help prevent major complications, such as:  Hardening and narrowing of arteries that supply blood to: ? Your heart. This can cause a heart  attack. ? Your brain. This can cause a stroke. ? Your kidneys. This can cause kidney failure.  Stress on your heart muscle, which can cause heart failure. What can I do to lower my risk?  Work with your health care provider to make a hypertension prevention plan that works for you. Follow your plan and keep all follow-up visits as told by your health care provider.  Learn how to check your blood pressure at home. Make sure that you know your personal target  blood pressure, as told by your health care provider. How is this treated? In addition to diet and lifestyle changes, your health care provider may recommend medicines to help lower your blood pressure. You may need to try a few different medicines to find what works best for you. You also may need to take more than one medicine. Take over-the-counter and prescription medicines only as told by your health care provider. Where to find support Your health care provider can help you prevent hypertension and help you keep your blood pressure at a healthy level. Your local hospital or your community may also provide support services and prevention programs. The American Heart Association offers an online support network at: CheapBootlegs.com.cy Where to find more information Learn more about hypertension from:  [place]: ElectronicHangman.is  Centers for Disease Control and Prevention: https://ingram.com/  American Academy of Family Physicians: http://familydoctor.org/familydoctor/en/diseases-conditions/high-blood-pressure.printerview.all.html Learn more about the DASH diet from:  [place], [facility] [place]: https://www.reyes.com/ Contact a health care provider if:  You think you are having a reaction to medicines you have taken.  You have recurrent headaches or feel dizzy.  You have swelling in your ankles.  You have trouble with your vision. Summary  Hypertension often does not cause any symptoms until blood pressure is very high. It is important to get your blood pressure checked regularly.  Diet and lifestyle changes are the most important steps in preventing hypertension.  By keeping your blood pressure in a healthy range, you can prevent complications like heart attack, heart failure, stroke, and kidney failure.  Work with your health care  provider to make a hypertension prevention plan that works for you. This information is not intended to replace advice given to you by your health care provider. Make sure you discuss any questions you have with your health care provider. Document Released: 09/24/2015 Document Revised: 05/20/2016 Document Reviewed: 05/20/2016 Elsevier Interactive Patient Education  2019 Elsevier Inc. Hypertension Hypertension, commonly called high blood pressure, is when the force of blood pumping through the arteries is too strong. The arteries are the blood vessels that carry blood from the heart throughout the body. Hypertension forces the heart to work harder to pump blood and may cause arteries to become narrow or stiff. Having untreated or uncontrolled hypertension can cause heart attacks, strokes, kidney disease, and other problems. A blood pressure reading consists of a higher number over a lower number. Ideally, your blood pressure should be below 120/80. The first ("top") number is called the systolic pressure. It is a measure of the pressure in your arteries as your heart beats. The second ("bottom") number is called the diastolic pressure. It is a measure of the pressure in your arteries as the heart relaxes. What are the causes? The cause of this condition is not known. What increases the risk? Some risk factors for high blood pressure are under your control. Others are not. Factors you can change  Smoking.  Having type 2 diabetes mellitus,  high cholesterol, or both.  Not getting enough exercise or physical activity.  Being overweight.  Having too much fat, sugar, calories, or salt (sodium) in your diet.  Drinking too much alcohol. Factors that are difficult or impossible to change  Having chronic kidney disease.  Having a family history of high blood pressure.  Age. Risk increases with age.  Race. You may be at higher risk if you are African-American.  Gender. Men are at higher risk  than women before age 25. After age 36, women are at higher risk than men.  Having obstructive sleep apnea.  Stress. What are the signs or symptoms? Extremely high blood pressure (hypertensive crisis) may cause:  Headache.  Anxiety.  Shortness of breath.  Nosebleed.  Nausea and vomiting.  Severe chest pain.  Jerky movements you cannot control (seizures). How is this diagnosed? This condition is diagnosed by measuring your blood pressure while you are seated, with your arm resting on a surface. The cuff of the blood pressure monitor will be placed directly against the skin of your upper arm at the level of your heart. It should be measured at least twice using the same arm. Certain conditions can cause a difference in blood pressure between your right and left arms. Certain factors can cause blood pressure readings to be lower or higher than normal (elevated) for a short period of time:  When your blood pressure is higher when you are in a health care provider's office than when you are at home, this is called white coat hypertension. Most people with this condition do not need medicines.  When your blood pressure is higher at home than when you are in a health care provider's office, this is called masked hypertension. Most people with this condition may need medicines to control blood pressure. If you have a high blood pressure reading during one visit or you have normal blood pressure with other risk factors:  You may be asked to return on a different day to have your blood pressure checked again.  You may be asked to monitor your blood pressure at home for 1 week or longer. If you are diagnosed with hypertension, you may have other blood or imaging tests to help your health care provider understand your overall risk for other conditions. How is this treated? This condition is treated by making healthy lifestyle changes, such as eating healthy foods, exercising more, and reducing  your alcohol intake. Your health care provider may prescribe medicine if lifestyle changes are not enough to get your blood pressure under control, and if:  Your systolic blood pressure is above 130.  Your diastolic blood pressure is above 80. Your personal target blood pressure may vary depending on your medical conditions, your age, and other factors. Follow these instructions at home: Eating and drinking   Eat a diet that is high in fiber and potassium, and low in sodium, added sugar, and fat. An example eating plan is called the DASH (Dietary Approaches to Stop Hypertension) diet. To eat this way: ? Eat plenty of fresh fruits and vegetables. Try to fill half of your plate at each meal with fruits and vegetables. ? Eat whole grains, such as whole wheat pasta, brown rice, or whole grain bread. Fill about one quarter of your plate with whole grains. ? Eat or drink low-fat dairy products, such as skim milk or low-fat yogurt. ? Avoid fatty cuts of meat, processed or cured meats, and poultry with skin. Fill about one quarter of  your plate with lean proteins, such as fish, chicken without skin, beans, eggs, and tofu. ? Avoid premade and processed foods. These tend to be higher in sodium, added sugar, and fat.  Reduce your daily sodium intake. Most people with hypertension should eat less than 1,500 mg of sodium a day.  Limit alcohol intake to no more than 1 drink a day for nonpregnant women and 2 drinks a day for men. One drink equals 12 oz of beer, 5 oz of wine, or 1 oz of hard liquor. Lifestyle   Work with your health care provider to maintain a healthy body weight or to lose weight. Ask what an ideal weight is for you.  Get at least 30 minutes of exercise that causes your heart to beat faster (aerobic exercise) most days of the week. Activities may include walking, swimming, or biking.  Include exercise to strengthen your muscles (resistance exercise), such as pilates or lifting weights,  as part of your weekly exercise routine. Try to do these types of exercises for 30 minutes at least 3 days a week.  Do not use any products that contain nicotine or tobacco, such as cigarettes and e-cigarettes. If you need help quitting, ask your health care provider.  Monitor your blood pressure at home as told by your health care provider.  Keep all follow-up visits as told by your health care provider. This is important. Medicines  Take over-the-counter and prescription medicines only as told by your health care provider. Follow directions carefully. Blood pressure medicines must be taken as prescribed.  Do not skip doses of blood pressure medicine. Doing this puts you at risk for problems and can make the medicine less effective.  Ask your health care provider about side effects or reactions to medicines that you should watch for. Contact a health care provider if:  You think you are having a reaction to a medicine you are taking.  You have headaches that keep coming back (recurring).  You feel dizzy.  You have swelling in your ankles.  You have trouble with your vision. Get help right away if:  You develop a severe headache or confusion.  You have unusual weakness or numbness.  You feel faint.  You have severe pain in your chest or abdomen.  You vomit repeatedly.  You have trouble breathing. Summary  Hypertension is when the force of blood pumping through your arteries is too strong. If this condition is not controlled, it may put you at risk for serious complications.  Your personal target blood pressure may vary depending on your medical conditions, your age, and other factors. For most people, a normal blood pressure is less than 120/80.  Hypertension is treated with lifestyle changes, medicines, or a combination of both. Lifestyle changes include weight loss, eating a healthy, low-sodium diet, exercising more, and limiting alcohol. This information is not  intended to replace advice given to you by your health care provider. Make sure you discuss any questions you have with your health care provider. Document Released: 09/09/2005 Document Revised: 08/07/2016 Document Reviewed: 08/07/2016 Elsevier Interactive Patient Education  2019 Elsevier Inc. Blood Pressure Record Sheet To take your blood pressure, you will need a blood pressure machine. You can buy a blood pressure machine (blood pressure monitor) at your clinic, drug store, or online. When choosing one, consider:  An automatic monitor that has an arm cuff.  A cuff that wraps snugly around your upper arm. You should be able to fit only one finger between  your arm and the cuff.  A device that stores blood pressure reading results.  Do not choose a monitor that measures your blood pressure from your wrist or finger. Follow your health care provider's instructions for how to take your blood pressure. To use this form:  Get one reading in the morning (a.m.) before you take any medicines.  Get one reading in the evening (p.m.) before supper.  Take at least 2 readings with each blood pressure check. This makes sure the results are correct. Wait 1-2 minutes between measurements.  Write down the results in the spaces on this form.  Repeat this once a week, or as told by your health care provider.  Make a follow-up appointment with your health care provider to discuss the results. Blood pressure log Date: _______________________  a.m. _____________________(1st reading) _____________________(2nd reading)  p.m. _____________________(1st reading) _____________________(2nd reading) Date: _______________________  a.m. _____________________(1st reading) _____________________(2nd reading)  p.m. _____________________(1st reading) _____________________(2nd reading) Date: _______________________  a.m. _____________________(1st reading) _____________________(2nd reading)  p.m.  _____________________(1st reading) _____________________(2nd reading) Date: _______________________  a.m. _____________________(1st reading) _____________________(2nd reading)  p.m. _____________________(1st reading) _____________________(2nd reading) Date: _______________________  a.m. _____________________(1st reading) _____________________(2nd reading)  p.m. _____________________(1st reading) _____________________(2nd reading) This information is not intended to replace advice given to you by your health care provider. Make sure you discuss any questions you have with your health care provider. Document Released: 06/08/2003 Document Revised: 09/09/2017 Document Reviewed: 09/09/2017 Elsevier Interactive Patient Education  2019 [employer].

## 2018-11-12 NOTE — Progress Notes (Signed)
Subjective:     Patient ID: Barbara Horn, female   DOB: [birth year], 38 y.o.   MRN: [id number]  HPI   Blood pressure (!) 160/92, pulse 70, resp. rate 16, SpO2 98 %.  Patient is a 38 year old female in no acute distress who reports she comes to the clinic to have her blood pressure checked as she was told that it was elevated when she was seen at the urgent care 3 days ago. She was seen by urgent care at [facility] and was treated for sinusitis, upper respiratory - she is currently on Levaquin. She has been on Levaquin day 3. She is on prednisone dose pack as well. She reports her pressure was 151/101 at that visit.She had taken Sudafed that day.   She has not been on Mucinex D since three days ago she reports..   She took Doxycycline for 10 days prior to the above visit for 2 weeks two weeks ago for same diagnosis she reports. She has been on four other rounds of antibiotics and sees Dr. Kristine Garbe her ear nose and throat physician. Reports she has regular follow-ups with this physician.  Denies any previous history of hypertension, she denies any chest pain or cardiac symptoms.  She has no radiating pain.  She denies any shortness of breath at rest or with exertion.  She reports she did previously have some mild shortness of breath but this is when she had bronchitis.  It resolved with prednisone treatment.   Patient  denies any fever, body aches,chills, rash, chest pain, shortness of breath, nausea, vomiting, or diarrhea.  Denies any pain or radiation pain.  She drinks diet sodas.  She reports she has been working long hours and does not have time to exercise and does not maintain the healthiest diet.  Review of Systems  Constitutional: Negative for activity change, appetite change, chills, diaphoresis, fatigue, fever and unexpected weight change.  HENT: Positive for congestion, postnasal drip, sinus pressure and sore throat. Negative for dental problem, drooling, ear discharge, ear pain,  facial swelling, hearing loss, mouth sores, nosebleeds, rhinorrhea, sinus pain, sneezing, tinnitus, trouble swallowing and voice change.   Respiratory: Positive for cough. Negative for apnea, choking, shortness of breath, wheezing and stridor.   Cardiovascular: Negative for chest pain, palpitations and leg swelling.  Gastrointestinal: Negative.   Genitourinary: Negative.   Musculoskeletal: Negative.   Allergic/Immunologic:        -- Sulfa Antibiotics -- Anaphylaxis, Hives and Other (See                           Comments)   --  Hives,lips swelled,nausea,headache,fever  -- Bee Venom -- Swelling  -- Gentamicin -- Itching   --  Itchy eyes and Face  -- Penicillins -- Rash and Other (See Comments)   --  Patient had reaction as a child and does not            remember the details.            Has patient had a PCN reaction causing immediate            rash, facial/tongue/throat swelling, SOB or            lightheadedness with hypotension: unknownHas            patient had a PCN reaction causing severe rash            involving mucus membranes or skin necrosis:  unknownHas patient had a PCN reaction that            required hospitalization: NoHas patient had a PCN            reaction occurring within the last 10 years: NoIf            all of the above answers are "NO", then may            proceed with Cep   Neurological: Negative.   Hematological: Negative.   Psychiatric/Behavioral: Negative.        She reports that all the above symptoms are improving with her current treatment of Levaquin and prednisone that she is on for the third day today.  She denies any worsening symptoms. Objective:   Physical Exam Constitutional:      General: She is not in acute distress.    Appearance: Normal appearance. She is not ill-appearing, toxic-appearing or diaphoretic.  HENT:     Head: Normocephalic and atraumatic.     Salivary Glands: Right salivary gland is not diffusely enlarged or  tender. Left salivary gland is not diffusely enlarged or tender.     Right Ear: Ear canal and external ear normal. There is no impacted cerumen. Tympanic membrane is erythematous.     Left Ear: Hearing, ear canal and external ear normal. There is no impacted cerumen. Tympanic membrane is erythematous (mild bilateral ).     Ears:     Comments: Patient denies ear pain says they have improved substantially since seen urgent care 3 days ago.    Nose: Congestion and rhinorrhea present.     Right Sinus: No maxillary sinus tenderness or frontal sinus tenderness.     Left Sinus: No maxillary sinus tenderness or frontal sinus tenderness.     Mouth/Throat:     Lips: Pink.     Mouth: Mucous membranes are moist.     Pharynx: Oropharynx is clear. Uvula midline. No oropharyngeal exudate or posterior oropharyngeal erythema.     Tonsils: No tonsillar exudate. Swelling: 0 on the right. 0 on the left.  Eyes:     General: No scleral icterus.       Right eye: No discharge.        Left eye: No discharge.     Extraocular Movements: Extraocular movements intact.     Conjunctiva/sclera: Conjunctivae normal.  Neck:     Musculoskeletal: Normal range of motion and neck supple. No neck rigidity or muscular tenderness.     Vascular: No carotid bruit.  Cardiovascular:     Rate and Rhythm: Normal rate and regular rhythm.     Pulses: Normal pulses.  Pulmonary:     Effort: Pulmonary effort is normal. No respiratory distress.     Breath sounds: Normal breath sounds. No stridor. No wheezing, rhonchi or rales.  Chest:     Chest wall: No tenderness.  Abdominal:     Palpations: Abdomen is soft.  Musculoskeletal: Normal range of motion.  Lymphadenopathy:     Cervical: No cervical adenopathy.     Right cervical: No deep or posterior cervical adenopathy.    Left cervical: No deep or posterior cervical adenopathy.  Skin:    General: Skin is warm and dry.     Capillary Refill: Capillary refill takes less than 2 seconds.      Nails: There is no clubbing.   Neurological:     General: No focal deficit present.     Mental Status: She is alert.  Motor: Motor function is intact.     Coordination: Coordination is intact.     Gait: Gait is intact. Gait normal.  Psychiatric:        Mood and Affect: Mood normal.        Behavior: Behavior normal.        Thought Content: Thought content normal.        Judgment: Judgment normal.        Assessment:     Blood pressure elevated without history of HTN  Recurrent acute suppurative otitis media of right ear without spontaneous rupture of tympanic membrane      Plan:     Patient did see her primary care provider Mikey College, NP on 08/25/2018 for annual physical and Pap smear.  Patient reports he has not had her labs done yet.  Patient is told that vita can see lab orders in the system and that she is able as a [employer] employee to come to this clinic for a lab draw and should call for an appointment, this is free of charge and will result back to her primary care provider.  Keep follow up with [facility] Nose [facility] Throat for current illness and treatment.   She is to follow up with Mikey College, NP within 1 week for blood pressure recheck.   Advised patient call the office or your primary care doctor for an appointment if no improvement within 72 hours or if any symptoms change or worsen at any time  Advised ER or urgent Care if after hours or on weekend. Call 911 for emergency symptoms at any time.Patinet verbalized understanding of all instructions given/reviewed and treatment plan and has no further questions or concerns at this time.    Patient verbalized understanding of all instructions given and denies any further questions at this time.

## 2019-04-12 IMAGING — CT CT ABD-PELV W/ CM
2 of 4 series · 17 of 46 positions shown, 19 images · IV contrast (APPLIED)
Comparison: None.

CLINICAL DATA: Intermittent right lower quadrant abdominal pain.

EXAM:
CT ABDOMEN AND PELVIS WITH CONTRAST
TECHNIQUE: Multidetector CT imaging of the abdomen and pelvis was performed
using the standard protocol following bolus administration of
intravenous contrast.
CONTRAST:  100mL G2H5SC-VL5 IOPAMIDOL (G2H5SC-VL5) INJECTION 76%

[Series 2: routine abd/pel with · axial · 0.85mm/px · z∈[-558,-118]mm · 14 of 96 slices shown, 16 images]
[im 4/96  soft-tissue]
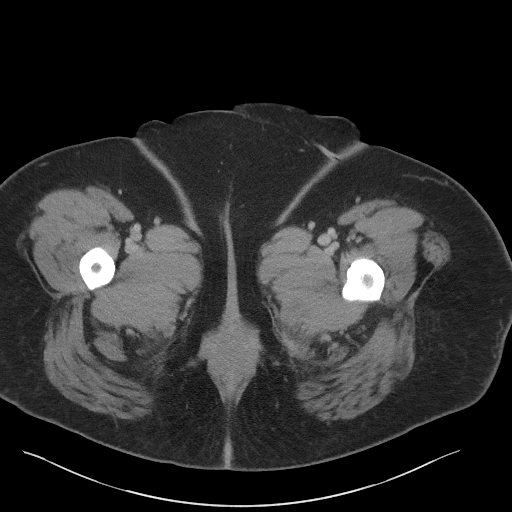
[im 4/96  bone]
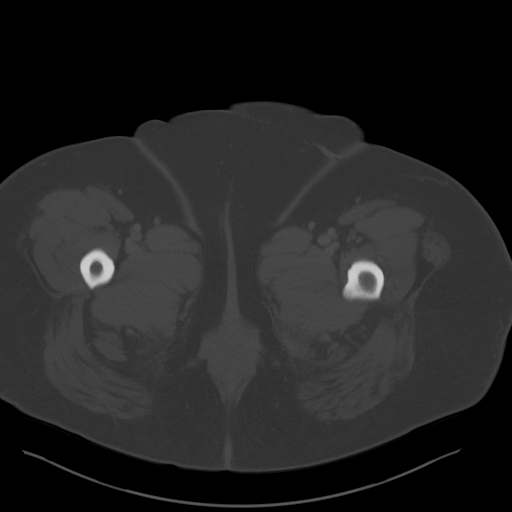
[im 12/96  soft-tissue]
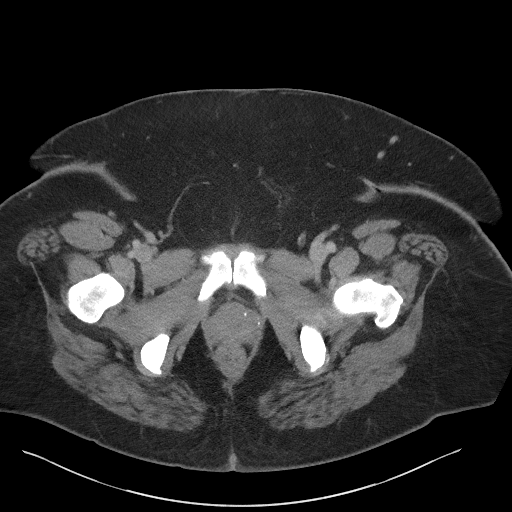
[im 20/96  soft-tissue]
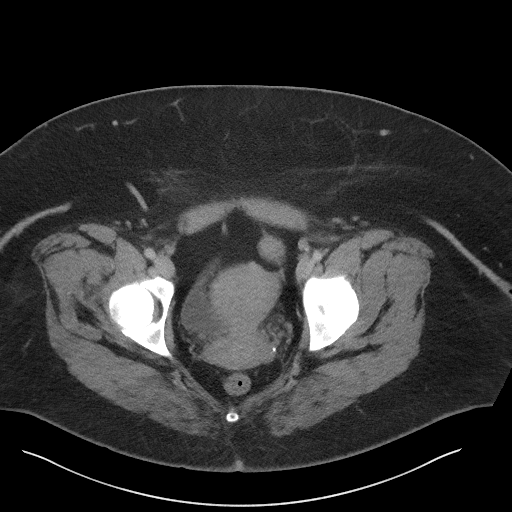
[im 24/96  soft-tissue]
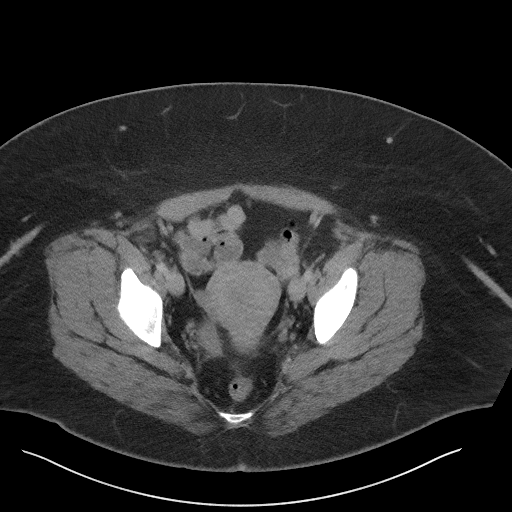
[im 32/96  soft-tissue]
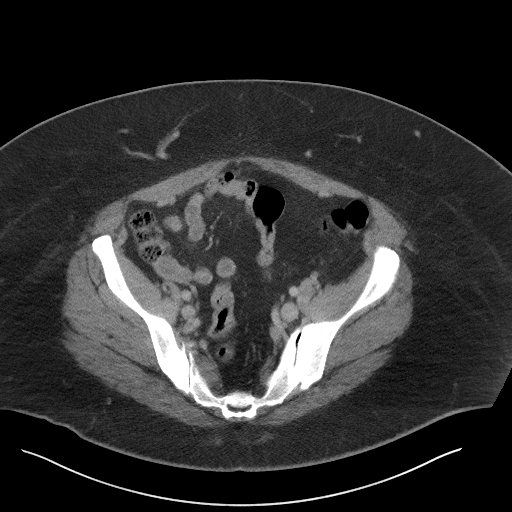
[im 40/96  soft-tissue]
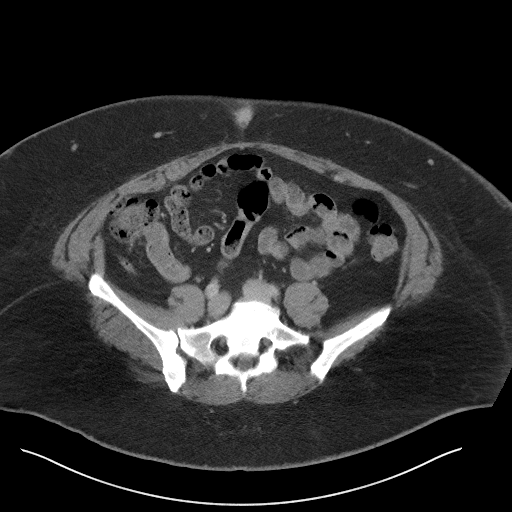
[im 44/96  soft-tissue]
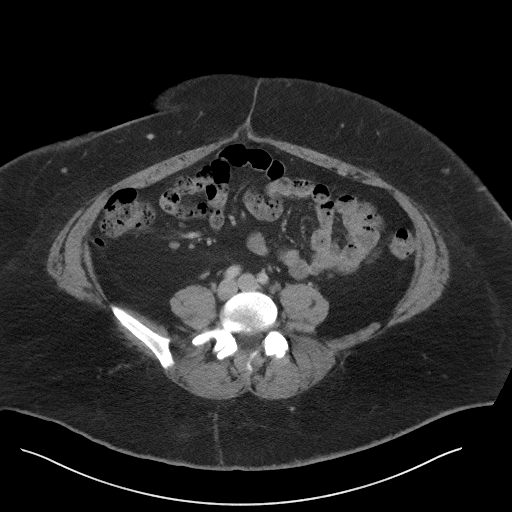
[im 52/96  soft-tissue]
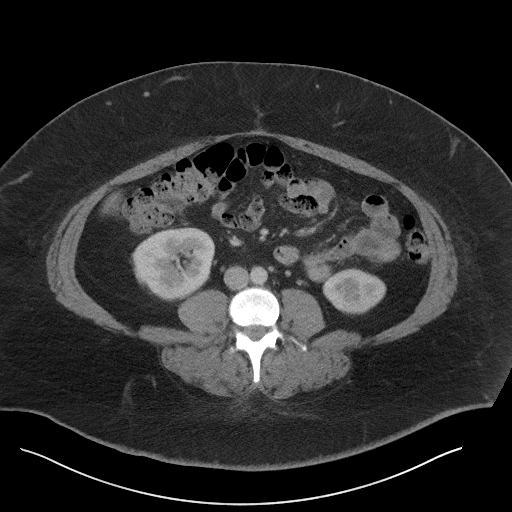
[im 56/96  soft-tissue]
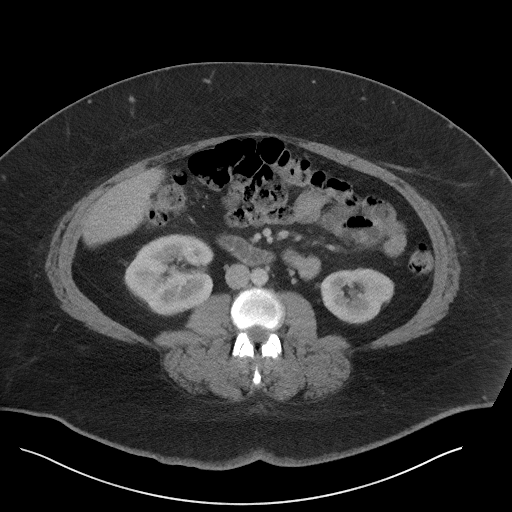
[im 56/96  bone]
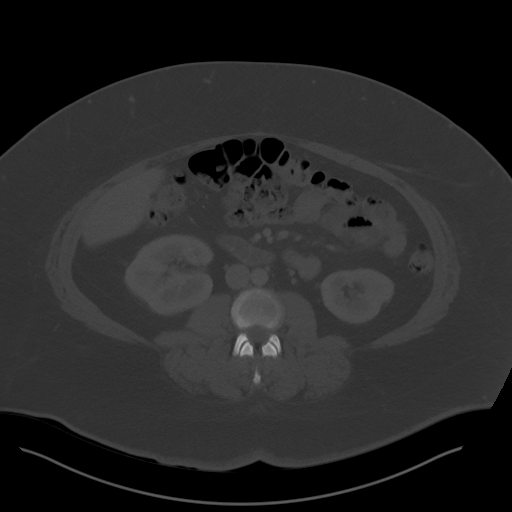
[im 64/96  soft-tissue]
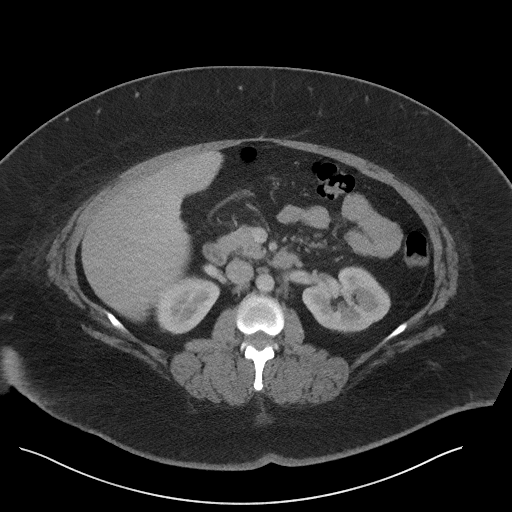
[im 72/96  soft-tissue]
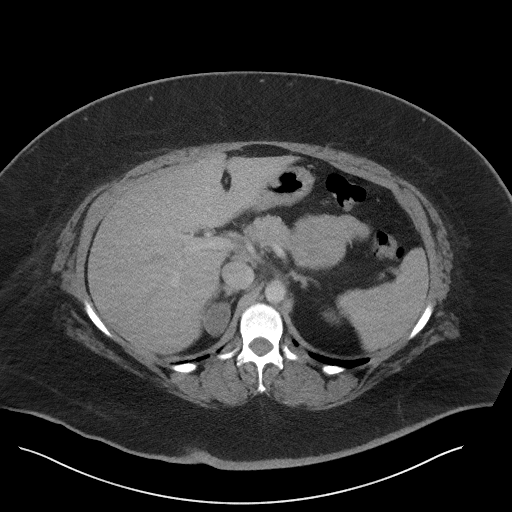
[im 76/96  soft-tissue]
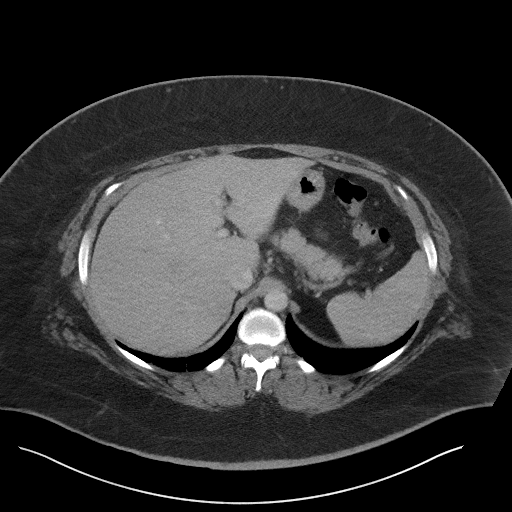
[im 84/96  soft-tissue]
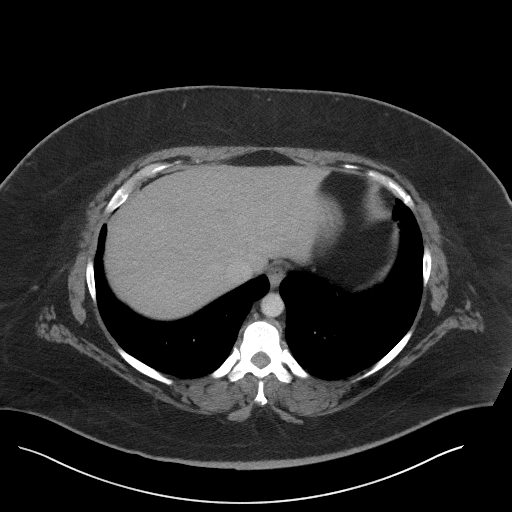
[im 92/96  soft-tissue]
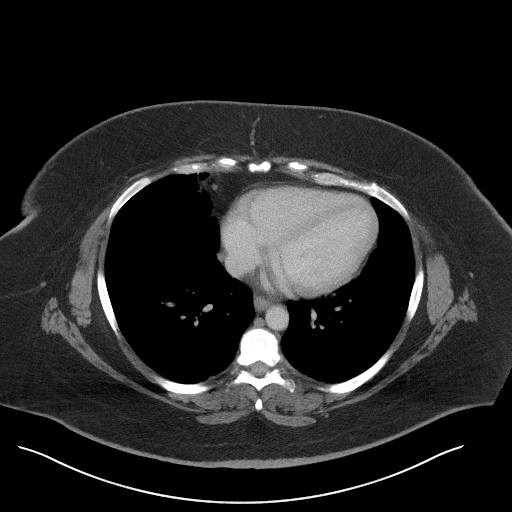

[Series 5: coronal st · coronal · 0.88mm/px · 3 of 118 slices shown]
[im 40/118  soft-tissue]
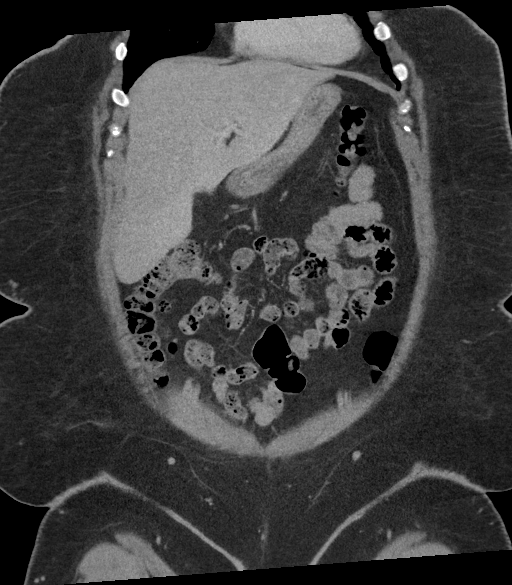
[im 53/118  soft-tissue]
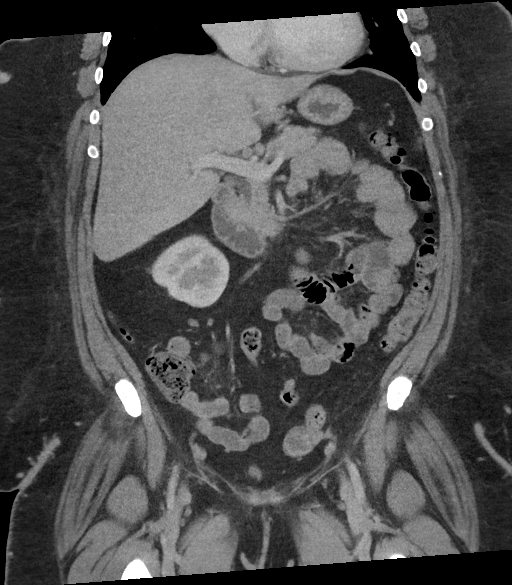
[im 66/118  soft-tissue]
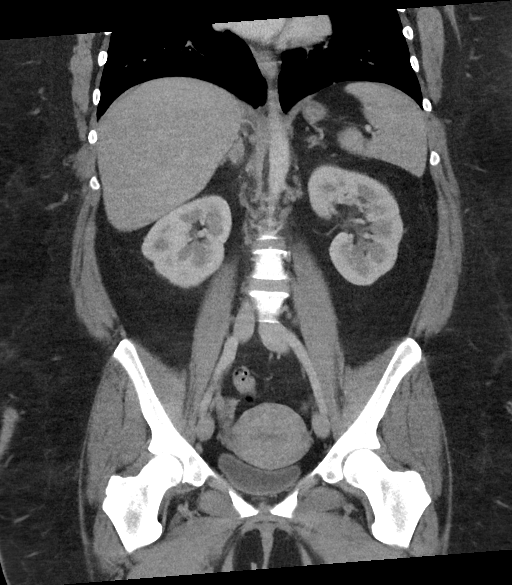

[17 of 46 positions shown; findings below may reference images not displayed]

FINDINGS: Lower chest: Normal heart size. Dependent atelectasis bilateral
lower lobes. No pleural effusion.

Hepatobiliary: Liver is normal in size and contour. No focal hepatic
lesions identified. Fatty deposition adjacent to the falciform
ligament. Gallbladder is surgically absent.

Pancreas: Unremarkable

Spleen: Unremarkable

Adrenals/Urinary Tract: Normal adrenal glands. 3.3 cm indeterminate
right adrenal nodule (image 25; series 2). Kidneys enhance
symmetrically with contrast. No hydronephrosis. Urinary bladder is
unremarkable.

Stomach/Bowel: No abnormal bowel wall thickening or evidence for
bowel obstruction. No free fluid or free intraperitoneal air. Normal
appendix. Normal morphology of the stomach.

Vascular/Lymphatic: Normal caliber abdominal aorta. No
retroperitoneal lymphadenopathy.

Reproductive: Uterus is unremarkable. Follicle left ovary. Prominent
right ovary measuring up to 4.6 cm.

Other: None.

Musculoskeletal: No aggressive or acute appearing osseous lesions.
IMPRESSION: The right ovary is mildly enlarged. In the setting of right lower
quadrant pain, consider further evaluation with pelvic ultrasound.

Normal appendix.

## 2019-06-08 ENCOUNTER — Other Ambulatory Visit: Payer: Self-pay | Admitting: *Deleted

## 2019-06-08 DIAGNOSIS — Z20822 Contact with and (suspected) exposure to covid-19: Secondary | ICD-10-CM

## 2019-06-10 LAB — NOVEL CORONAVIRUS, NAA: SARS-CoV-2, NAA: NOT DETECTED

## 2019-06-22 ENCOUNTER — Other Ambulatory Visit: Payer: Self-pay

## 2019-06-22 DIAGNOSIS — Z20822 Contact with and (suspected) exposure to covid-19: Secondary | ICD-10-CM

## 2019-06-24 LAB — NOVEL CORONAVIRUS, NAA: SARS-CoV-2, NAA: DETECTED — AB

## 2019-07-31 ENCOUNTER — Other Ambulatory Visit: Payer: Self-pay | Admitting: Nurse Practitioner

## 2019-07-31 DIAGNOSIS — M153 Secondary multiple arthritis: Secondary | ICD-10-CM

## 2019-08-24 ENCOUNTER — Other Ambulatory Visit: Payer: Self-pay | Admitting: Physician Assistant

## 2019-08-24 DIAGNOSIS — N6324 Unspecified lump in the left breast, lower inner quadrant: Secondary | ICD-10-CM

## 2019-08-27 ENCOUNTER — Encounter: Payer: Managed Care, Other (non HMO) | Admitting: Nurse Practitioner

## 2019-09-03 ENCOUNTER — Ambulatory Visit
Admission: RE | Admit: 2019-09-03 | Discharge: 2019-09-03 | Disposition: A | Discharge status: Home / Self Care | Payer: Managed Care, Other (non HMO) | Source: Ambulatory Visit | Attending: Physician Assistant | Admitting: Physician Assistant

## 2019-09-03 DIAGNOSIS — N6324 Unspecified lump in the left breast, lower inner quadrant: Secondary | ICD-10-CM | POA: Diagnosis present

## 2019-10-25 ENCOUNTER — Other Ambulatory Visit: Payer: Self-pay

## 2019-10-25 ENCOUNTER — Emergency Department
Admission: EM | Admit: 2019-10-25 | Discharge: 2019-10-25 | Disposition: A | Discharge status: Home / Self Care | Payer: Managed Care, Other (non HMO) | Attending: Emergency Medicine | Admitting: Emergency Medicine

## 2019-10-25 ENCOUNTER — Emergency Department: Payer: Managed Care, Other (non HMO)

## 2019-10-25 ENCOUNTER — Encounter: Payer: Self-pay | Admitting: Emergency Medicine

## 2019-10-25 DIAGNOSIS — Z79899 Other long term (current) drug therapy: Secondary | ICD-10-CM | POA: Diagnosis not present

## 2019-10-25 DIAGNOSIS — R079 Chest pain, unspecified: Secondary | ICD-10-CM | POA: Insufficient documentation

## 2019-10-25 DIAGNOSIS — M1990 Unspecified osteoarthritis, unspecified site: Secondary | ICD-10-CM | POA: Insufficient documentation

## 2019-10-25 DIAGNOSIS — R0789 Other chest pain: Secondary | ICD-10-CM

## 2019-10-25 LAB — CBC
HCT: 39.2 % (ref 36.0–46.0)
Hemoglobin: 13.2 g/dL (ref 12.0–15.0)
MCH: 28.5 pg (ref 26.0–34.0)
MCHC: 33.7 g/dL (ref 30.0–36.0)
MCV: 84.7 fL (ref 80.0–100.0)
Platelets: 265 10*3/uL (ref 150–400)
RBC: 4.63 MIL/uL (ref 3.87–5.11)
RDW: 13.7 % (ref 11.5–15.5)
WBC: 8.8 10*3/uL (ref 4.0–10.5)
nRBC: 0 % (ref 0.0–0.2)

## 2019-10-25 LAB — BASIC METABOLIC PANEL
Anion gap: 13 (ref 5–15)
BUN: 17 mg/dL (ref 6–20)
CO2: 26 mmol/L (ref 22–32)
Calcium: 9.1 mg/dL (ref 8.9–10.3)
Chloride: 101 mmol/L (ref 98–111)
Creatinine, Ser: 0.7 mg/dL (ref 0.44–1.00)
GFR calc Af Amer: >60 mL/min (ref >60)
GFR calc non Af Amer: >60 mL/min (ref >60)
Glucose, Bld: 101 mg/dL — ABNORMAL HIGH (ref 70–99)
Potassium: 3.5 mmol/L (ref 3.5–5.1)
Sodium: 140 mmol/L (ref 135–145)

## 2019-10-25 LAB — TROPONIN I (HIGH SENSITIVITY): Troponin I (High Sensitivity): 3 ng/L (ref <18)

## 2019-10-25 NOTE — ED Triage Notes (Signed)
Patient reports chest pain that started this afternoon. Patient states she had a confrontation with someone and pain became increasingly worse. Patient states she feels like it could be anxiety. Patient also coughing in triage. Reports history of asthma.

## 2019-10-25 NOTE — Discharge Instructions (Addendum)
Return to the ER for new, worsening, or persistent severe chest pain, difficulty breathing, weakness or lightheadedness, or any other new or worsening symptoms that concern you. 

## 2019-10-25 NOTE — ED Provider Notes (Signed)
[facility] Emergency Department Provider Note ____________________________________________   First MD Initiated Contact with Patient 10/25/19 1816     (approximate)  I have reviewed the triage vital signs and the nursing notes.   HISTORY  Chief Complaint Chest Pain    HPI Barbara Horn is a 39 y.o. female with PMH as noted below who presents with chest pain, gradual onset since earlier in the day, described as a stabbing sensation, and located substernally with no radiation. She states that in the late afternoon she had an argument with someone and felt the pain becoming more strong. She became concerned about whether it could be anything cardiac. It has now almost completely resolved, and the patient states she believes it was due to anxiety. She states she has had similar pain a few times in the past related to anxiety. She denies any associated shortness of breath, lightheadedness, nausea, or any leg swelling.  Past Medical History:  Diagnosis Date  . ADHD   . Allergy   . Arthritis   . Complication of anesthesia    WHEEZING AFTER GALLBLADDER SURGERY IN MAY 2017  . Noninflammatory disorder of ovary, fallopian tube, and broad ligament 07/05/2015  . PCOS (polycystic ovarian syndrome)   . Plantar fasciitis, bilateral   . Seasonal allergies   . Sleep apnea    cpap    Patient Active Problem List   Diagnosis Date Noted  . Sleep apnea   . Seasonal allergies   . PCOS (polycystic ovarian syndrome)   . Arthritis   . Bilateral ovarian cysts 09/20/2015  . Noninflammatory disorder of ovary, fallopian tube, and broad ligament 07/05/2015  . Morbid obesity with BMI of 50.0-59.9, adult ([place]) 06/28/2015    Past Surgical History:  Procedure Laterality Date  . CHOLECYSTECTOMY N/A 01/31/2016   Procedure: LAPAROSCOPIC CHOLECYSTECTOMY;  Surgeon: Jules Husbands, MD;  Location: [facility] ORS;  Service: General;  Laterality: N/A;  . ENDOSCOPIC CONCHA BULLOSA RESECTION  Bilateral 08/05/2016   Procedure: ENDOSCOPIC CONCHA BULLOSA RESECTION;  Surgeon: Margaretha Sheffield, MD;  Location: [facility] ORS;  Service: ENT;  Laterality: Bilateral;  . FOOT SURGERY     x2  . IMAGE GUIDED SINUS SURGERY Bilateral 08/05/2016   Procedure: IMAGE GUIDED SINUS SURGERY;  Surgeon: Margaretha Sheffield, MD;  Location: [facility] ORS;  Service: ENT;  Laterality: Bilateral;  . MAXILLARY ANTROSTOMY Right 08/05/2016   Procedure: MAXILLARY ANTROSTOMY WITH TISSUE REMOVAL;  Surgeon: Margaretha Sheffield, MD;  Location: [facility] ORS;  Service: ENT;  Laterality: Right;  . MOUTH SURGERY    . SEPTOPLASTY N/A 08/05/2016   Procedure: SEPTOPLASTY;  Surgeon: Margaretha Sheffield, MD;  Location: [facility] ORS;  Service: ENT;  Laterality: N/A;  . SPHENOIDECTOMY Left 08/05/2016   Procedure: Coralee Pesa;  Surgeon: Margaretha Sheffield, MD;  Location: [facility] ORS;  Service: ENT;  Laterality: Left;  . TONSILLECTOMY    . TURBINATE REDUCTION Bilateral 08/05/2016   Procedure: TURBINATE REDUCTION BILATERAL INFERIOR;  Surgeon: Margaretha Sheffield, MD;  Location: [facility] ORS;  Service: ENT;  Laterality: Bilateral;    Prior to Admission medications   Medication Sig Start Date End Date Taking? Authorizing Provider  albuterol (PROVENTIL HFA;VENTOLIN HFA) 108 (90 Base) MCG/ACT inhaler Inhale into the lungs. 11/10/18   [provider]  ciprofloxacin (CILOXAN) 0.3 % ophthalmic solution 2 drops in affected eye(s) 4 times a day x 7 days 09/22/18   Jacqulyn Cane, MD  diclofenac sodium (VOLTAREN) 1 % GEL Apply 4 g topically 4 (four) times daily. 05/22/18   Cassell Smiles  Renee, NP  EPINEPHrine 0.3 mg/0.3 mL IJ SOAJ injection INJECT 0.3 MLS (0.3 MG TOTAL) INTO THE MUSCLE ONCE FOR 1 DOSE. 05/22/18   [provider]  fluticasone (FLONASE) 50 MCG/ACT nasal spray PLACE 2 SPRAYS INTO BOTH NOSTRILS DAILY. 09/18/16   Caryn Section, Linden Dolin, PA-C  LILLOW 0.15-30 MG-MCG tablet TAKE 1 TABLET BY MOUTH DAILY. TAKE ACTIVE PILL DAILY FOR 6 WEEKS THEN SKIP 1 WEEK AND REPEAT CYCLE. 05/23/18    [provider]  meloxicam (MOBIC) 15 MG tablet TAKE 1 TABLET BY MOUTH EVERY DAY 08/03/19   Karamalegos, Devonne Doughty, DO  metFORMIN (GLUCOPHAGE-XR) 500 MG 24 hr tablet Take 1,000 mg by mouth at bedtime. 06/29/16   [provider]  montelukast (SINGULAIR) 10 MG tablet Take 1 tablet (10 mg total) by mouth at bedtime. 07/21/18   Darlyne Russian, MD  predniSONE (DELTASONE) 10 MG tablet 6 PO Q D X 1 DAY, THEN 5 PO Q D X 1 DAY, THEN 4 PO Q D X 1 DAY, THEN 3 PO Q D X 1 DAY, THEN 2 PO Q D X 1 DAY, THEN 1 PO Q D X 1 DAY 11/10/18   [provider]    Allergies Sulfa antibiotics, Bee venom, Gentamicin, and Penicillins  Family History  Problem Relation Age of Onset  . Diabetes Mother   . Hypertension Mother   . Heart attack Mother   . Diabetes Father   . Hypertension Father   . Bipolar disorder Father   . Ulcerative colitis Father   . Healthy Brother   . Heart attack Paternal Grandfather     Social History Social History   Tobacco Use  . Smoking status: Never Smoker  . Smokeless tobacco: Never Used  Substance Use Topics  . Alcohol use: Yes    Alcohol/week: 0.0 standard drinks    Comment: occasinally  . Drug use: No    Review of Systems  Constitutional: No fever/chills. Eyes: No visual changes. ENT: No sore throat. Cardiovascular: Positive for resolved chest pain. Respiratory: Denies shortness of breath. Gastrointestinal: No vomiting or diarrhea.  Genitourinary: Negative for dysuria.  Musculoskeletal: Negative for back pain. Skin: Negative for rash. Neurological: Negative for headache.   ____________________________________________   PHYSICAL EXAM:  VITAL SIGNS: ED Triage Vitals [10/25/19 1736]  Enc Vitals Group     BP (!) 144/88     Pulse Rate 85     Resp 20     Temp 98.2 F (36.8 C)     Temp Source Oral     SpO2 97 %     Weight (!) 325 lb (147.4 kg)     Height 5' (1.524 m)     Head Circumference      Peak Flow      Pain Score 0      Pain Loc      Pain Edu?      Excl. in [place]?     Constitutional: Alert and oriented. Well appearing and in no acute distress. Eyes: Conjunctivae are normal.  Head: Atraumatic. Nose: No congestion/rhinnorhea. Mouth/Throat: Mucous membranes are moist.   Neck: Normal range of motion.  Cardiovascular: Normal rate, regular rhythm. Grossly normal heart sounds.  Good peripheral circulation. Respiratory: Normal respiratory effort.  No retractions. Lungs CTAB. Gastrointestinal: No distention.  Musculoskeletal: No lower extremity edema.  Extremities warm and well perfused.  Neurologic:  Normal speech and language. No gross focal neurologic deficits are appreciated.  Skin:  Skin is warm and dry. No rash noted.  Psychiatric: Mood and affect are normal. Speech and behavior are normal.  ____________________________________________   LABS (all labs ordered are listed, but only abnormal results are displayed)  Labs Reviewed  BASIC METABOLIC PANEL - Abnormal; Notable for the following components:      Result Value   Glucose, Bld 101 (*)    All other components within normal limits  CBC  POC URINE PREG, ED  TROPONIN I (HIGH SENSITIVITY)  TROPONIN I (HIGH SENSITIVITY)   ____________________________________________  EKG  ED ECG REPORT I, Arta Silence, the attending physician, personally viewed and interpreted this ECG.  Date: 10/25/2019 EKG Time: 1729 Rate: 81 Rhythm: normal sinus rhythm QRS Axis: normal Intervals: normal ST/T Wave abnormalities: normal Narrative Interpretation: no evidence of acute ischemia  ____________________________________________  RADIOLOGY  CXR: No focal infiltrate or other acute abnormality  ____________________________________________   PROCEDURES  Procedure(s) performed: No  Procedures  Critical Care performed: No ____________________________________________   INITIAL IMPRESSION / ASSESSMENT AND PLAN / ED COURSE  Pertinent labs &  imaging results that were available during my care of the patient were reviewed by me and considered in my medical decision making (see chart for details).  39 year old female with PMH as noted above but no prior cardiac history or risk factors presents with atypical chest pain over the course of the day today, which acutely worsened during an argument. It has now resolved. The patient denies significant associated symptoms.  On exam, the patient is overall well-appearing and her vital signs are normal. The physical exam is otherwise unremarkable.  The EKG is nonischemic.  Overall presentation is consistent with chest pain related to anxiety, versus benign musculoskeletal cause. The patient has no clinical signs or symptoms of ACS, and no symptoms of a risk factors for PE.  The basic lab work-up and troponin are negative. Given the duration of the symptoms, there is no indication for a repeat troponin in this low risk patient. If her chest x-ray shows no acute findings, anticipate discharge home.  ----------------------------------------- 7:33 PM on 10/25/2019 -----------------------------------------  The chest x-ray is negative. The patient continues to feel well and wants to go home. I counseled her on the results of the work-up. Return precautions given, and she expresses understanding.  ____________________________________________   FINAL CLINICAL IMPRESSION(S) / ED DIAGNOSES  Final diagnoses:  Atypical chest pain      NEW MEDICATIONS STARTED DURING THIS VISIT:  Discharge Medication List as of 10/25/2019  6:34 PM       Note:  This document was prepared using Dragon voice recognition software and may include unintentional dictation errors.   Arta Silence, MD 10/25/19 [id number]

## 2019-11-05 ENCOUNTER — Other Ambulatory Visit: Payer: Self-pay | Admitting: Family Medicine

## 2019-11-05 DIAGNOSIS — M153 Secondary multiple arthritis: Secondary | ICD-10-CM

## 2019-11-20 ENCOUNTER — Ambulatory Visit: Payer: Managed Care, Other (non HMO) | Attending: Internal Medicine

## 2019-11-20 ENCOUNTER — Other Ambulatory Visit: Payer: Self-pay

## 2019-11-20 DIAGNOSIS — Z23 Encounter for immunization: Secondary | ICD-10-CM | POA: Insufficient documentation

## 2019-11-20 NOTE — Progress Notes (Signed)
   Covid-19 Vaccination Clinic  Name:  Barbara Horn    MRN: [id number] DOB: 09/04/1981  11/20/2019  Barbara Horn was observed post Covid-19 immunization for 30 minutes based on pre-vaccination screening without incidence. She was provided with Vaccine Information Sheet and instruction to access the V-Safe system.   Barbara Horn was instructed to call 911 with any severe reactions post vaccine: Marland Kitchen Difficulty breathing  . Swelling of your face and throat  . A fast heartbeat  . A bad rash all over your body  . Dizziness and weakness    Immunizations Administered    Name Date Dose VIS Date Route   Pfizer COVID-19 Vaccine 11/20/2019 10:41 AM 0.3 mL 09/03/2019 Intramuscular   Manufacturer: [place]   Lot: [id number]   [place]: [id number]

## 2019-12-11 ENCOUNTER — Ambulatory Visit: Payer: Managed Care, Other (non HMO) | Attending: Internal Medicine

## 2019-12-11 DIAGNOSIS — Z23 Encounter for immunization: Secondary | ICD-10-CM

## 2019-12-11 NOTE — Progress Notes (Signed)
   Covid-19 Vaccination Clinic  Name:  Barbara Horn    MRN: [id number] DOB: 09-Mar-1981  12/11/2019  [name] was observed post Covid-19 immunization for 30 minutes based on pre-vaccination screening without incident. She was provided with Vaccine Information Sheet and instruction to access the V-Safe system.   [name] was instructed to call 911 with any severe reactions post vaccine: Marland Kitchen Difficulty breathing  . Swelling of face and throat  . A fast heartbeat  . A bad rash all over body  . Dizziness and weakness   Immunizations Administered    Name Date Dose VIS Date Route   Pfizer COVID-19 Vaccine 12/11/2019 11:25 AM 0.3 mL 09/03/2019 Intramuscular   Manufacturer: [place]   Lot: [id number]   [place]: [id number]

## 2019-12-20 ENCOUNTER — Ambulatory Visit: Payer: Self-pay

## 2021-06-08 ENCOUNTER — Emergency Department
Admission: EM | Admit: 2021-06-08 | Discharge: 2021-06-08 | Disposition: A | Discharge status: Home / Self Care | Payer: BC Managed Care – PPO | Attending: Emergency Medicine | Admitting: Emergency Medicine

## 2021-06-08 ENCOUNTER — Encounter: Payer: Self-pay | Admitting: Emergency Medicine

## 2021-06-08 ENCOUNTER — Other Ambulatory Visit: Payer: Self-pay

## 2021-06-08 DIAGNOSIS — R519 Headache, unspecified: Secondary | ICD-10-CM | POA: Insufficient documentation

## 2021-06-08 DIAGNOSIS — Z5321 Procedure and treatment not carried out due to patient leaving prior to being seen by health care provider: Secondary | ICD-10-CM | POA: Diagnosis not present

## 2021-06-08 NOTE — ED Triage Notes (Signed)
C/O headache x 1 week.  Seen through PCP last week for same.  Referred to ED by [facility] today.  AAOx3.  Skin warm and dry. NAD.  Ambulates with easy and steady gait.  NAD

## 2021-08-28 ENCOUNTER — Other Ambulatory Visit: Payer: Self-pay | Admitting: Physician Assistant

## 2021-08-28 DIAGNOSIS — Z1231 Encounter for screening mammogram for malignant neoplasm of breast: Secondary | ICD-10-CM

## 2022-11-06 ENCOUNTER — Other Ambulatory Visit: Payer: Self-pay | Admitting: Physician Assistant

## 2022-11-06 DIAGNOSIS — Z1231 Encounter for screening mammogram for malignant neoplasm of breast: Secondary | ICD-10-CM

## 2023-02-18 ENCOUNTER — Ambulatory Visit
Admission: RE | Admit: 2023-02-18 | Discharge: 2023-02-18 | Disposition: A | Discharge status: Home / Self Care | Payer: BC Managed Care – PPO | Source: Ambulatory Visit | Attending: Physician Assistant | Admitting: Physician Assistant

## 2023-02-18 DIAGNOSIS — Z1231 Encounter for screening mammogram for malignant neoplasm of breast: Secondary | ICD-10-CM | POA: Diagnosis present

## 2023-06-26 DIAGNOSIS — Z23 Encounter for immunization: Secondary | ICD-10-CM | POA: Diagnosis not present

## 2023-10-20 ENCOUNTER — Emergency Department
Admission: EM | Admit: 2023-10-20 | Discharge: 2023-10-20 | Disposition: A | Discharge status: Home / Self Care | Payer: BC Managed Care – PPO | Attending: Emergency Medicine | Admitting: Emergency Medicine

## 2023-10-20 ENCOUNTER — Emergency Department: Payer: BC Managed Care – PPO

## 2023-10-20 ENCOUNTER — Other Ambulatory Visit: Payer: Self-pay

## 2023-10-20 DIAGNOSIS — M542 Cervicalgia: Secondary | ICD-10-CM | POA: Diagnosis not present

## 2023-10-20 DIAGNOSIS — W108XXA Fall (on) (from) other stairs and steps, initial encounter: Secondary | ICD-10-CM | POA: Diagnosis not present

## 2023-10-20 DIAGNOSIS — S0990XA Unspecified injury of head, initial encounter: Secondary | ICD-10-CM | POA: Insufficient documentation

## 2023-10-20 DIAGNOSIS — R519 Headache, unspecified: Secondary | ICD-10-CM

## 2023-10-20 MED ORDER — METHOCARBAMOL 500 MG PO TABS: 500.0000 mg | ORAL_TABLET | Freq: Three times a day (TID) | ORAL | 0 refills | Status: AC | PRN

## 2023-10-20 NOTE — ED Provider Triage Note (Signed)
Emergency Medicine Provider Triage Evaluation Note  Barbara Horn , a 43 y.o. female  was evaluated in triage.  Pt complains of headache and dizziness after falling 2 days ago.  Hit wooden railing.    Review of Systems  Positive: Dizziness, headache left sided.  + nausea Negative: No vomiting  Physical Exam  Ht 5' (1.524 m)   Wt 91.6 kg   LMP 10/08/2023 (Approximate)   BMI 39.45 kg/m  Gen:   Awake, no distress   answers questions, cooperative Resp:  Normal effort  MSK:   Moves extremities without difficulty  Other:  Speech normal  Medical Decision Making  Medically screening exam initiated at 1:40 PM.  Appropriate orders placed.  Barbara Horn was informed that the remainder of the evaluation will be completed by another provider, this initial triage assessment does not replace that evaluation, and the importance of remaining in the ED until their evaluation is complete.     Tommi Rumps, PA-C 10/20/23 1342

## 2023-10-20 NOTE — ED Provider Notes (Signed)
[facility] Provider Note    Event Date/Time   First MD Initiated Contact with Patient 10/20/23 1715     (approximate)   History   Fall   HPI  Barbara Horn is a 43 y.o. female with history of PCOS and as listed in EMR presents to the emergency department for treatment and evaluation after slipping and hitting her head on the stair railing 2 days ago.  She continues to have headache, dizziness, and neck tenderness.Marland Kitchen      Physical Exam   Triage Vital Signs: ED Triage Vitals  Encounter Vitals Group     BP 10/20/23 1340 (!) 138/99     Systolic BP Percentile --      Diastolic BP Percentile --      Pulse Rate 10/20/23 1340 74     Resp 10/20/23 1340 18     Temp 10/20/23 1340 98.5 F (36.9 C)     Temp Source 10/20/23 1340 Oral     SpO2 10/20/23 1340 99 %     Weight 10/20/23 1338 202 lb (91.6 kg)     Height 10/20/23 1338 5' (1.524 m)     Head Circumference --      Peak Flow --      Pain Score 10/20/23 1337 6     Pain Loc --      Pain Education --      Exclude from Growth Chart --     Most recent vital signs: Vitals:   10/20/23 1340 10/20/23 1726  BP: (!) 138/99 (!) 134/93  Pulse: 74 78  Resp: 18 16  Temp: 98.5 F (36.9 C)   SpO2: 99% 100%    General: Awake, no distress.  Alert and oriented x 4 CV:  Good peripheral perfusion.  Resp:  Normal effort.  Abd:  No distention.  Other:  No focal midline tenderness over the cervical vertebrae.   ED Results / Procedures / Treatments   Labs (all labs ordered are listed, but only abnormal results are displayed) Labs Reviewed - No data to display   EKG  Not indicated   RADIOLOGY  Image and radiology report reviewed and interpreted by me. Radiology report consistent with the same.  CT head and cervical spine both negative for acute concerns.  PROCEDURES:  Critical Care performed: No  Procedures   MEDICATIONS ORDERED IN ED:  Medications - No data to display   IMPRESSION /  MDM / ASSESSMENT AND PLAN / ED COURSE   I have reviewed the triage note.  Differential diagnosis includes, but is not limited to, concussion, musculoskeletal strain, intracranial hemorrhage, skull fracture  Patient's presentation is most consistent with acute illness / injury with system symptoms.  43 year old female presenting to the emergency department for treatment evaluation 2 days after slipping and hitting her head on the stair railing.  She reports she continues to have a headache and neck pain.  CT of her head and cervical spine are both negative.  Patient feels reassured by the negative results. On exam, she has some tenderness on the left lateral neck. Plan will be to treat her with Robaxin and have her continue Tylenol.  She was encouraged to follow-up with her primary care provider if not improving over the week.  If she has new symptoms of concern and is unable to see her primary care provider, she was advised to return to the emergency department.      FINAL CLINICAL IMPRESSION(S) / ED DIAGNOSES  Final diagnoses:  Acute nonintractable headache, unspecified headache type  Minor head injury, initial encounter     Rx / DC Orders   ED Discharge Orders          Ordered    methocarbamol (ROBAXIN) 500 MG tablet  Every 8 hours PRN        10/20/23 1721             Note:  This document was prepared using Dragon voice recognition software and may include unintentional dictation errors.   Chinita Pester, FNP 10/20/23 2257    Jene Every, MD 10/20/23 2259

## 2023-10-20 NOTE — ED Triage Notes (Signed)
Pt sts that she fell two days ago. Pt sts that there was no LOC. Pt denies blood thinners.

## 2023-11-26 ENCOUNTER — Other Ambulatory Visit: Payer: Self-pay | Admitting: Physician Assistant

## 2023-11-26 DIAGNOSIS — Z1231 Encounter for screening mammogram for malignant neoplasm of breast: Secondary | ICD-10-CM

## 2024-01-12 ENCOUNTER — Emergency Department
Admission: EM | Admit: 2024-01-12 | Discharge: 2024-01-12 | Disposition: A | Discharge status: Home / Self Care | Attending: Emergency Medicine | Admitting: Emergency Medicine

## 2024-01-12 ENCOUNTER — Other Ambulatory Visit: Payer: Self-pay

## 2024-01-12 ENCOUNTER — Emergency Department

## 2024-01-12 DIAGNOSIS — R4781 Slurred speech: Secondary | ICD-10-CM | POA: Insufficient documentation

## 2024-01-12 DIAGNOSIS — R251 Tremor, unspecified: Secondary | ICD-10-CM | POA: Diagnosis present

## 2024-01-12 DIAGNOSIS — R002 Palpitations: Secondary | ICD-10-CM | POA: Insufficient documentation

## 2024-01-12 LAB — CBC
HCT: 38.1 % (ref 36.0–46.0)
Hemoglobin: 13.1 g/dL (ref 12.0–15.0)
MCH: 29.5 pg (ref 26.0–34.0)
MCHC: 34.4 g/dL (ref 30.0–36.0)
MCV: 85.8 fL (ref 80.0–100.0)
Platelets: 215 10*3/uL (ref 150–400)
RBC: 4.44 MIL/uL (ref 3.87–5.11)
RDW: 12.9 % (ref 11.5–15.5)
WBC: 6 10*3/uL (ref 4.0–10.5)
nRBC: 0 % (ref 0.0–0.2)

## 2024-01-12 LAB — LIPASE, BLOOD: Lipase: 38 U/L (ref 11–51)

## 2024-01-12 LAB — COMPREHENSIVE METABOLIC PANEL WITH GFR
ALT: 17 U/L (ref 0–44)
AST: 18 U/L (ref 15–41)
Albumin: 4.1 g/dL (ref 3.5–5.0)
Alkaline Phosphatase: 45 U/L (ref 38–126)
Anion gap: 8 (ref 5–15)
BUN: 16 mg/dL (ref 6–20)
CO2: 26 mmol/L (ref 22–32)
Calcium: 9.1 mg/dL (ref 8.9–10.3)
Chloride: 105 mmol/L (ref 98–111)
Creatinine, Ser: 0.75 mg/dL (ref 0.44–1.00)
GFR, Estimated: >60 mL/min (ref >60)
Glucose, Bld: 86 mg/dL (ref 70–99)
Potassium: 4 mmol/L (ref 3.5–5.1)
Sodium: 139 mmol/L (ref 135–145)
Total Bilirubin: 0.6 mg/dL (ref 0.0–1.2)
Total Protein: 7.3 g/dL (ref 6.5–8.1)

## 2024-01-12 LAB — URINALYSIS, ROUTINE W REFLEX MICROSCOPIC
Bilirubin Urine: NEGATIVE
Glucose, UA: NEGATIVE mg/dL
Hgb urine dipstick: NEGATIVE
Ketones, ur: NEGATIVE mg/dL
Leukocytes,Ua: NEGATIVE
Nitrite: NEGATIVE
Protein, ur: NEGATIVE mg/dL
Specific Gravity, Urine: 1.003 — ABNORMAL LOW (ref 1.005–1.030)
pH: 7 (ref 5.0–8.0)

## 2024-01-12 LAB — MAGNESIUM: Magnesium: 1.9 mg/dL (ref 1.7–2.4)

## 2024-01-12 LAB — CBG MONITORING, ED: Glucose-Capillary: 85 mg/dL (ref 70–99)

## 2024-01-12 LAB — TSH: TSH: 0.457 u[IU]/mL (ref 0.350–4.500)

## 2024-01-12 LAB — TROPONIN I (HIGH SENSITIVITY): Troponin I (High Sensitivity): 2 ng/L (ref <18)

## 2024-01-12 LAB — POC URINE PREG, ED: Preg Test, Ur: NEGATIVE

## 2024-01-12 NOTE — ED Provider Notes (Signed)
 [facility] Provider Note    Event Date/Time   First MD Initiated Contact with Patient 01/12/24 1758     (approximate)   History   Chief Complaint Tremors   HPI  Barbara Horn is a 43 y.o. female with past medical history of PCOS who presents to the ED complaining of tremors.  Patient reports that about an hour prior to arrival she suddenly began feeling very shaky across her entire body, had some stuttering in her speech noted by her mother at that time.  She felt like her heart might be racing with some palpitations, but denies any chest pain or shortness of breath.  She did not have any vision changes, facial droop, numbness or weakness in her extremities.  Symptoms lasted for about 2 hours total and have since resolved.  She denies any history of similar episodes.  She does report issues with hypoglycemia recently, is not diabetic but does take metformin for PCOS.  However, with the episode today she checked her CBG at home and found it to be in the 80's.     Physical Exam   Triage Vital Signs: ED Triage Vitals  Encounter Vitals Group     BP 01/12/24 1354 (!) 145/112     Systolic BP Percentile --      Diastolic BP Percentile --      Pulse Rate 01/12/24 1354 68     Resp 01/12/24 1354 20     Temp 01/12/24 1354 98.4 F (36.9 C)     Temp src --      SpO2 01/12/24 1354 100 %     Weight --      Height --      Head Circumference --      Peak Flow --      Pain Score 01/12/24 1352 0     Pain Loc --      Pain Education --      Exclude from Growth Chart --     Most recent vital signs: Vitals:   01/12/24 1354 01/12/24 1944  BP: (!) 145/112 (!) 132/98  Pulse: 68 76  Resp: 20 17  Temp: 98.4 F (36.9 C)   SpO2: 100% 100%    Constitutional: Alert and oriented. Eyes: Conjunctivae are normal. Head: Atraumatic. Nose: No congestion/rhinnorhea. Mouth/Throat: Mucous membranes are moist.  Cardiovascular: Normal rate, regular rhythm. Grossly normal  heart sounds.  2+ radial pulses bilaterally. Respiratory: Normal respiratory effort.  No retractions. Lungs CTAB. Gastrointestinal: Soft and nontender. No distention. Musculoskeletal: No lower extremity tenderness nor edema.  Neurologic:  Normal speech and language. No gross focal neurologic deficits are appreciated.    ED Results / Procedures / Treatments   Labs (all labs ordered are listed, but only abnormal results are displayed) Labs Reviewed  URINALYSIS, ROUTINE W REFLEX MICROSCOPIC - Abnormal; Notable for the following components:      Result Value   Color, Urine COLORLESS (*)    APPearance CLEAR (*)    Specific Gravity, Urine 1.003 (*)    All other components within normal limits  POC URINE PREG, ED - Normal  CBC  COMPREHENSIVE METABOLIC PANEL WITH GFR  LIPASE, BLOOD  TSH  MAGNESIUM  CBG MONITORING, ED  TROPONIN I (HIGH SENSITIVITY)     EKG  ED ECG REPORT I, Twilla Galea, the attending physician, personally viewed and interpreted this ECG.   Date: 01/12/2024  EKG Time: 18:27  Rate: 69  Rhythm: normal sinus rhythm  Axis: Normal  Intervals: Incomplete RBBB  ST&T Change: None  RADIOLOGY CT head reviewed and interpreted by me with no hemorrhage or midline shift.  PROCEDURES:  Critical Care performed: No  Procedures   MEDICATIONS ORDERED IN ED: Medications - No data to display   IMPRESSION / MDM / ASSESSMENT AND PLAN / ED COURSE  I reviewed the triage vital signs and the nursing notes.                              43 y.o. female with past medical history of PCOS who presents to the ED following episode of shakiness and some stuttering speech that lasted for about 2 hours.  Patient's presentation is most consistent with acute presentation with potential threat to life or bodily function.  Differential diagnosis includes, but is not limited to, stroke, TIA, hypoglycemia, arrhythmia, anemia, electrolyte abnormality, AKI, anxiety.  Patient  nontoxic-appearing and in no acute distress, vital signs are unremarkable.  EKG shows no evidence of arrhythmia or ischemia, symptoms have resolved and patient has a nonfocal neurologic exam.  Stroke or TIA seem unlikely given symptoms she experienced sound nonfocal, but will screen CT imaging of her head.  Labs are reassuring with no significant anemia, leukocytosis, electrolyte abnormality, or AKI.  TSH and magnesium levels are also unremarkable.  Pregnancy testing is negative and urinalysis with no signs of infection.  Plan to observe on cardiac monitor and add on troponin given associated palpitations.  CT head is negative for acute process, troponin within normal limits and no events noted on cardiac monitor.  Patient asymptomatic on reassessment and is appropriate for discharge home with PCP follow-up.  Patient counseled to return to the ED for new or worsening symptoms, patient agrees with plan.      FINAL CLINICAL IMPRESSION(S) / ED DIAGNOSES   Final diagnoses:  Tremors of nervous system  Slurred speech  Palpitations     Rx / DC Orders   ED Discharge Orders     None        Note:  This document was prepared using Dragon voice recognition software and may include unintentional dictation errors.   Twilla Galea, MD 01/12/24 2211

## 2024-01-12 NOTE — ED Triage Notes (Addendum)
 Pt comes with c/o hypoglycemia. Pt states last reading was 86. Pt is not diabetic. Pt states she has been having headache. Pt states she had bariatric surgery 2 yrs ago. Pt states she was not sure if it was low. Pt checked it and noticed it .

## 2024-01-12 NOTE — ED Notes (Signed)
 Pt was feeling off and has been having a lot of headaches recently and they've been trying to figure out what has been causing them. Pt has been wearing a CBG monitor recently (4 days) and it registered low this morning (overnight at 54) and it was 86. Pt stated that they had some apple juice and her sugar went up and she felt better. After her lunch break today she was feeling a little off and was having "what feels like restless leg syndrome but all over my body" and was having trouble "getting her tongue to work and get my words out". Pt's mother stated that when she spoke to her on the phone when this happened the pt's words were slurred and pt's mother could tell that something was wrong. Pt is speaking in full sentences and states that they are able to get their words out without issue.

## 2024-01-12 NOTE — ED Provider Triage Note (Signed)
 Emergency Medicine Provider Triage Evaluation Note  Barbara Horn , a 43 y.o. female  was evaluated in triage.  Pt complains of trembling, low glucose, had headache but it stopped when trembling started, hx gastric sleeve 2 years ago.  Review of Systems  Positive:  Negative:   Physical Exam  BP (!) 145/112   Pulse 68   Temp 98.4 F (36.9 C)   Resp 20   SpO2 100%  Gen:   Awake, no distress   Resp:  Normal effort  MSK:   Moves extremities without difficulty  Other:    Medical Decision Making  Medically screening exam initiated at 2:01 PM.  Appropriate orders placed.  [name] was informed that the remainder of the evaluation will be completed by another provider, this initial triage assessment does not replace that evaluation, and the importance of remaining in the ED until their evaluation is complete.     Delsie Figures, PA-C 01/12/24 1402

## 2024-01-12 NOTE — ED Notes (Signed)
 Called CCMD to put her on the monitor.

## 2024-02-19 ENCOUNTER — Ambulatory Visit

## 2024-07-15 ENCOUNTER — Emergency Department

## 2024-07-15 DIAGNOSIS — R451 Restlessness and agitation: Secondary | ICD-10-CM | POA: Diagnosis not present

## 2024-07-15 DIAGNOSIS — I493 Ventricular premature depolarization: Secondary | ICD-10-CM | POA: Insufficient documentation

## 2024-07-15 DIAGNOSIS — R002 Palpitations: Secondary | ICD-10-CM | POA: Diagnosis present

## 2024-07-15 LAB — URINALYSIS, ROUTINE W REFLEX MICROSCOPIC
Bilirubin Urine: NEGATIVE
Glucose, UA: NEGATIVE mg/dL
Hgb urine dipstick: NEGATIVE
Ketones, ur: NEGATIVE mg/dL
Leukocytes,Ua: NEGATIVE
Nitrite: NEGATIVE
Protein, ur: NEGATIVE mg/dL
Specific Gravity, Urine: 1.021 (ref 1.005–1.030)
pH: 6 (ref 5.0–8.0)

## 2024-07-15 LAB — BASIC METABOLIC PANEL WITH GFR
Anion gap: 13 (ref 5–15)
BUN: 14 mg/dL (ref 6–20)
CO2: 23 mmol/L (ref 22–32)
Calcium: 8.9 mg/dL (ref 8.9–10.3)
Chloride: 103 mmol/L (ref 98–111)
Creatinine, Ser: 0.63 mg/dL (ref 0.44–1.00)
GFR, Estimated: >60 mL/min (ref >60)
Glucose, Bld: 76 mg/dL (ref 70–99)
Potassium: 3.7 mmol/L (ref 3.5–5.1)
Sodium: 139 mmol/L (ref 135–145)

## 2024-07-15 LAB — CBC
HCT: 37.8 % (ref 36.0–46.0)
Hemoglobin: 11.8 g/dL — ABNORMAL LOW (ref 12.0–15.0)
MCH: 26 pg (ref 26.0–34.0)
MCHC: 31.2 g/dL (ref 30.0–36.0)
MCV: 83.3 fL (ref 80.0–100.0)
Platelets: 191 K/uL (ref 150–400)
RBC: 4.54 MIL/uL (ref 3.87–5.11)
RDW: 17.6 % — ABNORMAL HIGH (ref 11.5–15.5)
WBC: 6 K/uL (ref 4.0–10.5)
nRBC: 0 % (ref 0.0–0.2)

## 2024-07-15 LAB — POC URINE PREG, ED: Preg Test, Ur: NEGATIVE

## 2024-07-15 LAB — TROPONIN I (HIGH SENSITIVITY): Troponin I (High Sensitivity): <2 ng/L (ref <18)

## 2024-07-15 LAB — PREGNANCY, URINE: Preg Test, Ur: NEGATIVE

## 2024-07-15 NOTE — ED Triage Notes (Signed)
 Pt arrives to ED d/t increased palpitations beginning in the past hour. Pt stated that she feels like her heart is fluttering and is having some dizzy spells intermittently. Pt also states that she is feeling like she can't get a full deep breath.

## 2024-07-16 ENCOUNTER — Emergency Department
Admission: EM | Admit: 2024-07-16 | Discharge: 2024-07-16 | Disposition: A | Discharge status: Home / Self Care | Attending: Emergency Medicine | Admitting: Emergency Medicine

## 2024-07-16 DIAGNOSIS — R451 Restlessness and agitation: Secondary | ICD-10-CM

## 2024-07-16 DIAGNOSIS — I493 Ventricular premature depolarization: Secondary | ICD-10-CM

## 2024-07-16 LAB — MAGNESIUM: Magnesium: 1.9 mg/dL (ref 1.7–2.4)

## 2024-07-16 LAB — D-DIMER, QUANTITATIVE: D-Dimer, Quant: 0.45 ug{FEU}/mL (ref 0.00–0.50)

## 2024-07-16 LAB — TSH: TSH: 1.175 u[IU]/mL (ref 0.350–4.500)

## 2024-07-16 LAB — TROPONIN I (HIGH SENSITIVITY): Troponin I (High Sensitivity): <2 ng/L (ref <18)

## 2024-07-16 MED ORDER — ROPINIROLE HCL 1 MG PO TABS
1.0000 mg | ORAL_TABLET | Freq: Once | ORAL | Status: AC
  Administered 2024-07-16: 1 mg via ORAL
  Filled 2024-07-16: qty 1

## 2024-07-16 MED ORDER — SODIUM CHLORIDE 0.9 % IV BOLUS (SEPSIS)
1000.0000 mL | Freq: Once | INTRAVENOUS | Status: AC
  Administered 2024-07-16: 1000 mL via INTRAVENOUS

## 2024-07-16 MED ORDER — METOPROLOL TARTRATE 25 MG PO TABS
12.5000 mg | ORAL_TABLET | Freq: Once | ORAL | Status: AC
  Administered 2024-07-16: 12.5 mg via ORAL
  Filled 2024-07-16 (×2): qty 1

## 2024-07-16 MED ORDER — LORAZEPAM 1 MG PO TABS: 1.0000 mg | ORAL_TABLET | Freq: Three times a day (TID) | ORAL | 0 refills | Status: AC | PRN

## 2024-07-16 MED ORDER — METOPROLOL TARTRATE 25 MG PO TABS: 12.5000 mg | ORAL_TABLET | Freq: Two times a day (BID) | ORAL | 0 refills | Status: DC

## 2024-07-16 MED ORDER — ROPINIROLE HCL 1 MG PO TABS: 1.0000 mg | ORAL_TABLET | Freq: Every evening | ORAL | 0 refills | Status: AC | PRN

## 2024-07-16 MED ORDER — LORAZEPAM 2 MG/ML IJ SOLN
1.0000 mg | Freq: Once | INTRAMUSCULAR | Status: AC
  Administered 2024-07-16: 1 mg via INTRAVENOUS
  Filled 2024-07-16 (×2): qty 1

## 2024-07-16 NOTE — ED Provider Notes (Signed)
 [facility] Provider Note    Event Date/Time   First MD Initiated Contact with Patient 07/16/24 [phone]     (approximate)   History   Palpitations   HPI  Barbara Horn is a 43 y.o. female with history of PCOS, gastric sleeve, sleep apnea who presents emergency department her husband for concerns for palpitations.  States she always has palpitations but this was more frequent.  Her husband is a paramedic took her to the local station and found that she was having runs of bigeminy and trigeminy.  She denies any chest pain, shortness of breath.  States she feels shaky all over and restless.  Has had the symptoms before but has never been on medication for restless leg.  No fevers, cough, vomiting, diarrhea.  No change in caffeine intake.  Patient reports drinking 80 ounces of water a day.  No prior history of PE or DVT.  No calf tenderness or calf swelling.   History provided by patient, husband.    Past Medical History:  Diagnosis Date   ADHD    Allergy    Arthritis    Complication of anesthesia    WHEEZING AFTER GALLBLADDER SURGERY IN MAY 2017   Noninflammatory disorder of ovary, fallopian tube, and broad ligament 07/05/2015   PCOS (polycystic ovarian syndrome)    Plantar fasciitis, bilateral    Seasonal allergies    Sleep apnea    cpap    Past Surgical History:  Procedure Laterality Date   CHOLECYSTECTOMY N/A 01/31/2016   Procedure: LAPAROSCOPIC CHOLECYSTECTOMY;  Surgeon: Laneta JULIANNA Luna, MD;  Location: [facility] ORS;  Service: General;  Laterality: N/A;   ENDOSCOPIC CONCHA BULLOSA RESECTION Bilateral 08/05/2016   Procedure: ENDOSCOPIC CONCHA BULLOSA RESECTION;  Surgeon: Deward Argue, MD;  Location: [facility] ORS;  Service: ENT;  Laterality: Bilateral;   FOOT SURGERY     x2   IMAGE GUIDED SINUS SURGERY Bilateral 08/05/2016   Procedure: IMAGE GUIDED SINUS SURGERY;  Surgeon: Deward Argue, MD;  Location: [facility] ORS;  Service: ENT;  Laterality: Bilateral;    MAXILLARY ANTROSTOMY Right 08/05/2016   Procedure: MAXILLARY ANTROSTOMY WITH TISSUE REMOVAL;  Surgeon: Deward Argue, MD;  Location: [facility] ORS;  Service: ENT;  Laterality: Right;   MOUTH SURGERY     SEPTOPLASTY N/A 08/05/2016   Procedure: SEPTOPLASTY;  Surgeon: Deward Argue, MD;  Location: [facility] ORS;  Service: ENT;  Laterality: N/A;   SPHENOIDECTOMY Left 08/05/2016   Procedure: CLEONE;  Surgeon: Deward Argue, MD;  Location: [facility] OR[facility];  Service: ENT;  Laterality: Left;   TONSILLECTOMY     TURBINATE REDUCTION Bilateral 08/05/2016   Procedure: TURBINATE REDUCTION BILATERAL INFERIOR;  Surgeon: Deward Argue, MD;  Location: [facility] ORS;  Service: ENT;  Laterality: Bilateral;    MEDICATIONS:  Prior to Admission medications   Medication Sig Start Date End Date Taking? Authorizing Provider  albuterol  (PROVENTIL  HFA;VENTOLIN  HFA) 108 (90 Base) MCG/ACT inhaler Inhale into the lungs. 11/10/18   [provider]  ciprofloxacin  (CILOXAN ) 0.3 % ophthalmic solution 2 drops in affected eye(s) 4 times a day x 7 days 09/22/18   Jaycee Lenis, MD  diclofenac  sodium (VOLTAREN ) 1 % GEL Apply 4 g topically 4 (four) times daily. 05/22/18   Nyle Tinnie Fuller, NP  EPINEPHrine  0.3 mg/0.3 mL IJ SOAJ injection INJECT 0.3 MLS (0.3 MG TOTAL) INTO THE MUSCLE ONCE FOR 1 DOSE. 05/22/18   [provider]  fluticasone  (FLONASE ) 50 MCG/ACT nasal spray PLACE 2 SPRAYS INTO BOTH NOSTRILS DAILY. 09/18/16  Gasper Devere ORN, PA-C  LILLOW 0.15-30 MG-MCG tablet TAKE 1 TABLET BY MOUTH DAILY. TAKE ACTIVE PILL DAILY FOR 6 WEEKS THEN SKIP 1 WEEK AND REPEAT CYCLE. 05/23/18   [provider]  meloxicam  (MOBIC ) 15 MG tablet TAKE 1 TABLET BY MOUTH EVERY DAY 08/03/19   Karamalegos, Marsa PARAS, DO  metFORMIN (GLUCOPHAGE-XR) 500 MG 24 hr tablet Take 1,000 mg by mouth at bedtime. 06/29/16   [provider]  methocarbamol  (ROBAXIN ) 500 MG tablet Take 1 tablet (500 mg total) by mouth every 8 (eight) hours as needed  for muscle spasms. 10/20/23   Triplett, Kirk B, FNP  montelukast  (SINGULAIR ) 10 MG tablet Take 1 tablet (10 mg total) by mouth at bedtime. 07/21/18   Humberto Elspeth LABOR, MD  predniSONE  (DELTASONE ) 10 MG tablet 6 PO Q D X 1 DAY, THEN 5 PO Q D X 1 DAY, THEN 4 PO Q D X 1 DAY, THEN 3 PO Q D X 1 DAY, THEN 2 PO Q D X 1 DAY, THEN 1 PO Q D X 1 DAY 11/10/18   [provider]    Physical Exam   Triage Vital Signs: ED Triage Vitals [07/15/24 2311]  Encounter Vitals Group     BP (!) 156/102     Girls Systolic BP Percentile      Girls Diastolic BP Percentile      Boys Systolic BP Percentile      Boys Diastolic BP Percentile      Pulse Rate 67     Resp 18     Temp 98.5 F (36.9 C)     Temp Source Oral     SpO2 100 %     Weight      Height      Head Circumference      Peak Flow      Pain Score      Pain Loc      Pain Education      Exclude from Growth Chart     Most recent vital signs: Vitals:   07/16/24 0330 07/16/24 0400  BP: (!) 125/98 111/81  Pulse: 74 79  Resp: 15 20  Temp:    SpO2: 100% 98%    CONSTITUTIONAL: Alert, responds appropriately to questions. Well-appearing; well-nourished HEAD: Normocephalic, atraumatic EYES: Conjunctivae clear, pupils appear equal, sclera nonicteric ENT: normal nose; moist mucous membranes NECK: Supple, normal ROM CARD: RRR; S1 and S2 appreciated, occasional PVCs noted on cardiac monitoring RESP: Normal chest excursion without splinting or tachypnea; breath sounds clear and equal bilaterally; no wheezes, no rhonchi, no rales, no hypoxia or respiratory distress, speaking full sentences ABD/GI: Non-distended; soft, non-tender, no rebound, no guarding, no peritoneal signs BACK: The back appears normal EXT: Normal ROM in all joints; no deformity noted, no edema, no calf swelling or calf tenderness SKIN: Normal color for age and race; warm; no rash on exposed skin NEURO: Moves all extremities equally, normal speech PSYCH: The patient's mood and  manner are appropriate.   ED Results / Procedures / Treatments   LABS: (all labs ordered are listed, but only abnormal results are displayed) Labs Reviewed  CBC - Abnormal; Notable for the following components:      Result Value   Hemoglobin 11.8 (*)    RDW 17.6 (*)    All other components within normal limits  URINALYSIS, ROUTINE W REFLEX MICROSCOPIC - Abnormal; Notable for the following components:   Color, Urine YELLOW (*)    APPearance HAZY (*)  All other components within normal limits  BASIC METABOLIC PANEL WITH GFR  PREGNANCY, URINE  MAGNESIUM  TSH  D-DIMER, QUANTITATIVE  POC URINE PREG, ED  TROPONIN I (HIGH SENSITIVITY)  TROPONIN I (HIGH SENSITIVITY)     EKG:  EKG Interpretation Date/Time:  Thursday July 15 2024 23:13:58 EDT Ventricular Rate:  68 PR Interval:  122 QRS Duration:  92 QT Interval:  364 QTC Calculation: 387 R Axis:   32  Text Interpretation: Sinus rhythm with sinus arrhythmia with occasional Premature ventricular complexes Cannot rule out Anterior infarct , age undetermined Abnormal ECG When compared with ECG of 12-Jan-2024 18:27, Premature ventricular complexes are now Present Confirmed by Neomi Neptune [id number]) on 07/16/2024 12:54:59 AM         RADIOLOGY: My personal review and interpretation of imaging: Chest x-ray clear.  I have personally reviewed all radiology reports.   DG Chest Port 1 View Result Date: 07/15/2024 CLINICAL DATA:  Chest pain EXAM: PORTABLE CHEST 1 VIEW COMPARISON:  10/25/2019 FINDINGS: The heart size and mediastinal contours are within normal limits. Both lungs are clear. The visualized skeletal structures are unremarkable. IMPRESSION: No active disease. Electronically Signed   By: Luke Bun M.D.   On: 07/15/2024 23:44     PROCEDURES:  Critical Care performed: No     .1-3 Lead EKG Interpretation  Performed by: Doron Shake, Neptune SAILOR, DO Authorized by: Khelani Kops, Neptune SAILOR, DO     Interpretation: normal     ECG  rate:  62   ECG rate assessment: normal     Rhythm: sinus rhythm     Ectopy: none     Conduction: normal       IMPRESSION / MDM / ASSESSMENT AND PLAN / ED COURSE  I reviewed the triage vital signs and the nursing notes.    Patient here for palpitations and found to have bigeminy, trigeminy on EMS twelve-lead.  Now having occasional PVCs on cardiac monitoring.  Also feeling restless, anxious.  The patient is on the cardiac monitor to evaluate for evidence of arrhythmia and/or significant heart rate changes.   DIFFERENTIAL DIAGNOSIS (includes but not limited to):   Anemia, electrolyte derangement, thyroid  dysfunction, dehydration, ectopy, less likely ACS, PE.  Doubt dissection, pneumonia, pneumothorax.   Patient's presentation is most consistent with acute presentation with potential threat to life or bodily function.   PLAN: Workup initiated from triage.  Hemoglobin of 11.8.  Normal potassium, magnesium.  Normal TSH.  First troponin negative.  Second pending.  Will also obtain D-dimer.  Pregnancy test negative.  Urine shows no sign of infection or dehydration.  Chest x-ray reviewed and interpreted by myself and the radiologist and is clear.  Will give IV fluids and continue to monitor on cardiac monitoring.   MEDICATIONS GIVEN IN ED: Medications  sodium chloride  0.9 % bolus 1,000 mL (0 mLs Intravenous Stopped 07/16/24 0308)  LORazepam (ATIVAN) injection 1 mg (1 mg Intravenous Given 07/16/24 0328)  rOPINIRole (REQUIP) tablet 1 mg (1 mg Oral Given 07/16/24 0324)  metoprolol  tartrate (LOPRESSOR ) tablet 12.5 mg (12.5 mg Oral Given 07/16/24 0329)  sodium chloride  0.9 % bolus 1,000 mL (1,000 mLs Intravenous New Bag/Given 07/16/24 0324)     ED COURSE: Patient still having occasional PVCs on cardiac monitoring.  She states she is still symptomatic from this.  There has not been any witnessed bigeminy, trigeminy here.  She states as long as I am not good to have a heart attack.   Discussed with patient and husband that  bigeminy and trigeminy do not put her at risk for ACS.  Given she is very symptomatic, recommended cardiology follow-up and discussed risk and benefits of starting a beta-blocker.  She would like to proceed.  Will give metoprolol  here.  Baseline heart rate currently in the 70s.  She also states she still feels very restless with it starting in her feet and now moving up into her entire body.  She was very shaky earlier.  She has had the symptoms previously.  She has not talked to her primary care doctor about this.  Will give Ativan, Requip for symptomatic relief and reassess.   4:15 AM  Pt reports feeling better.  Still occasional PVCs but no bigeminy, trigeminy and there has not been any noted arrhythmia including nonsustained ventricular tachycardia here.  Hemodynamically stable.  Will discharge with prescriptions for Ativan, Requip to take as needed for anxiousness, restlessness and will start her on metoprolol  12.5 mg twice daily I have placed referral for cardiology for further management of her frequent PVCs, palpitations.   At this time, I do not feel there is any life-threatening condition present. I reviewed all nursing notes, vitals, pertinent previous records.  All lab and urine results, EKGs, imaging ordered have been independently reviewed and interpreted by myself.  I reviewed all available radiology reports from any imaging ordered this visit.  Based on my assessment, I feel the patient is safe to be discharged home without further emergent workup and can continue workup as an outpatient as needed. Discussed all findings, treatment plan as well as usual and customary return precautions.  They verbalize understanding and are comfortable with this plan.  Outpatient follow-up has been provided as needed.  All questions have been answered.   CONSULTS:  none   OUTSIDE RECORDS REVIEWED: Reviewed recent internal medicine notes.       FINAL  CLINICAL IMPRESSION(S) / ED DIAGNOSES   Final diagnoses:  PVC's (premature ventricular contractions)  Restlessness     Rx / DC Orders   ED Discharge Orders          Ordered    Ambulatory referral to Cardiology       Comments: If you have not heard from the Cardiology office within the next 72 hours please call [phone].   07/16/24 0238    LORazepam (ATIVAN) 1 MG tablet  Every 8 hours PRN        07/16/24 0416    rOPINIRole (REQUIP) 1 MG tablet  At bedtime PRN        07/16/24 0416    metoprolol  tartrate (LOPRESSOR ) 25 MG tablet  2 times daily        07/16/24 0416             Note:  This document was prepared using Dragon voice recognition software and may include unintentional dictation errors.   Maika Kaczmarek, Josette SAILOR, DO 07/16/24 [phone]

## 2024-07-16 NOTE — ED Notes (Signed)
 Patient did not receive 2nd bolus of fluids due to v/s improving, MD made aware

## 2024-08-09 NOTE — Progress Notes (Unsigned)
 Cardiology Office Note  Date:  08/10/2024   ID:  MALEKA CONTINO, DOB 04-22-1981, MRN [id number]  PCP:  Marikay Eva POUR, PA   Chief Complaint  Patient presents with   New Patient (Initial Visit)    Ref by Josette Sink, DO for evaluation of PVC's. Patient was at [facility] ER 07/16/2024 with PVC's. Patient c/o shortness of breath, PVC's that come and go with occasional fluttering in chest.     HPI:  SHANNELL MIKKELSEN is a 43 y.o. female with past medical history of: Past Medical History:  Diagnosis Date   ADHD    Allergy    Arthritis    Complication of anesthesia    WHEEZING AFTER GALLBLADDER SURGERY IN MAY 2017   Noninflammatory disorder of ovary, fallopian tube, and broad ligament 07/05/2015   PCOS (polycystic ovarian syndrome)    Plantar fasciitis, bilateral    Seasonal allergies    Sleep apnea    cpap  Anxiety Who presents by referral from Dr. Josette Ward for consultation of her anxiety, PVCs  Reports that she has numerous children at home, 7 Two are biological, many were adopted Age range from 6 up to 53 The 63-year-old boy with ADHD, very challenging When she gets off work at 3:00, feels her stress level go up, palpitations seem to present more at that time as she is getting more stressed.  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  Seen in the emergency room July 16, 2024 appreciated anxiety, palpitations On telemetry noted to have PVCs in a bigeminal and trigeminal pattern, was feeling shaky all over and restless Treated with Ativan, requip, metoprolol  pill, fluids Anxiety improved on medications and she was discharged home  Lab work reviewed, periodic low potassium Since the ER has been taking Ativan as needed, metoprolol  tartrate 12.5 twice daily  EKG  personally reviewed by myself on todays visit EKG Interpretation Date/Time:  Tuesday August 10 2024 16:13:16 EST Ventricular Rate:  58 PR Interval:  128 QRS Duration:  94 QT Interval:  390 QTC Calculation: 382 R Axis:   -9  Text Interpretation: Sinus bradycardia When compared with ECG of 15-Jul-2024 23:13, Premature ventricular complexes are no longer Present Confirmed by Perla Lye [phone]) on 08/10/2024 4:18:41 PM    PMH:   has a past medical history of ADHD, Allergy, Arthritis, Complication of anesthesia, Noninflammatory disorder of ovary, fallopian tube, and broad ligament (07/05/2015), PCOS (polycystic ovarian syndrome), Plantar fasciitis, bilateral, Seasonal allergies, and Sleep apnea.  PSH:    Past Surgical History:  Procedure Laterality Date   CHOLECYSTECTOMY N/A 01/31/2016   Procedure: LAPAROSCOPIC CHOLECYSTECTOMY;  Surgeon: Laneta JULIANNA Luna, MD;  Location: [facility] ORS;  Service: General;  Laterality: N/A;   ENDOSCOPIC CONCHA BULLOSA RESECTION Bilateral 08/05/2016   Procedure: ENDOSCOPIC CONCHA BULLOSA RESECTION;  Surgeon: Deward Argue, MD;  Location: [facility] ORS;  Service: ENT;  Laterality: Bilateral;   FOOT SURGERY     x2   IMAGE GUIDED SINUS SURGERY Bilateral 08/05/2016   Procedure: IMAGE GUIDED SINUS SURGERY;  Surgeon: Deward Argue, MD;  Location: [facility] ORS;  Service: ENT;  Laterality: Bilateral;   MAXILLARY ANTROSTOMY Right 08/05/2016   Procedure: MAXILLARY ANTROSTOMY WITH TISSUE REMOVAL;  Surgeon: Deward Argue, MD;  Location: [facility] ORS;  Service: ENT;  Laterality: Right;  MOUTH SURGERY     SEPTOPLASTY N/A 08/05/2016   Procedure: SEPTOPLASTY;  Surgeon: Deward Argue, MD;  Location: [facility] OR[facility];  Service: ENT;  Laterality: N/A;   SPHENOIDECTOMY Left 08/05/2016   Procedure: CLEONE;  Surgeon: Deward Argue, MD;  Location: [facility] OR[facility];  Service: ENT;  Laterality: Left;   TONSILLECTOMY     TURBINATE REDUCTION Bilateral 08/05/2016   Procedure: TURBINATE REDUCTION BILATERAL  INFERIOR;  Surgeon: Deward Argue, MD;  Location: [facility] ORS;  Service: ENT;  Laterality: Bilateral;    Current Outpatient Medications  Medication Sig Dispense Refill   buPROPion (WELLBUTRIN XL) 300 MG 24 hr tablet Take 300 mg by mouth daily.     Docusate Sodium  (DSS) 100 MG CAPS Take 100 mg by mouth as needed.     ferrous sulfate 325 (65 FE) MG tablet Take 325 mg by mouth daily with breakfast.     fluticasone  (FLONASE ) 50 MCG/ACT nasal spray PLACE 2 SPRAYS INTO BOTH NOSTRILS DAILY. 16 g 5   metFORMIN (GLUCOPHAGE-XR) 500 MG 24 hr tablet Take 1,000 mg by mouth at bedtime.  11   metoprolol  tartrate (LOPRESSOR ) 25 MG tablet Take 0.5 tablets (12.5 mg total) by mouth 2 (two) times daily. 30 tablet 0   polyethylene glycol (MIRALAX / GLYCOLAX) 17 g packet Take 17 g by mouth daily.     rOPINIRole (REQUIP) 1 MG tablet Take 1 tablet (1 mg total) by mouth at bedtime as needed (restlessness). 15 tablet 0   albuterol  (PROVENTIL  HFA;VENTOLIN  HFA) 108 (90 Base) MCG/ACT inhaler Inhale into the lungs. (Patient not taking: Reported on 08/10/2024)     EPINEPHrine  0.3 mg/0.3 mL IJ SOAJ injection INJECT 0.3 MLS (0.3 MG TOTAL) INTO THE MUSCLE ONCE FOR 1 DOSE. (Patient not taking: Reported on 08/10/2024)  1   LILLOW 0.15-30 MG-MCG tablet TAKE 1 TABLET BY MOUTH DAILY. TAKE ACTIVE PILL DAILY FOR 6 WEEKS THEN SKIP 1 WEEK AND REPEAT CYCLE.  1   LORazepam (ATIVAN) 1 MG tablet Take 1 tablet (1 mg total) by mouth every 8 (eight) hours as needed for anxiety. (Patient not taking: Reported on 08/10/2024) 15 tablet 0   meloxicam  (MOBIC ) 15 MG tablet TAKE 1 TABLET BY MOUTH EVERY DAY (Patient not taking: Reported on 08/10/2024) 90 tablet 0   methocarbamol  (ROBAXIN ) 500 MG tablet Take 1 tablet (500 mg total) by mouth every 8 (eight) hours as needed for muscle spasms. (Patient not taking: Reported on 08/10/2024) 30 tablet 0   No current facility-administered medications for this visit.    Allergies:   Sulfa  antibiotics, Penicillins,  Bee venom, and Gentamicin   Social History:  The patient  reports that she has never smoked. She has never used smokeless tobacco. She reports current alcohol use. She reports that she does not use drugs.   Family History:   family history includes Bipolar disorder in her father; Diabetes in her father and mother; Healthy in her brother; Heart attack in her mother and paternal grandfather; Hypertension in her father and mother; Ulcerative colitis in her father.    Review of Systems: Review of Systems  Constitutional: Negative.   HENT: Negative.    Respiratory: Negative.    Cardiovascular: Negative.   Gastrointestinal: Negative.   Musculoskeletal: Negative.   Neurological: Negative.   Psychiatric/Behavioral: Negative.    All other systems reviewed and are negative.    PHYSICAL EXAM: VS:  BP 130/80 (BP Location: Right Arm, Patient Position: Sitting, Cuff Size: Normal)   Pulse (!) 58   Ht 5' 1 (  1.549 m)   Wt 198 lb 6 oz (90 kg)   SpO2 100%   BMI 37.48 kg/m  , BMI Body mass index is 37.48 kg/m. GEN: Well nourished, well developed, in no acute distress HEENT: normal Neck: no JVD, carotid bruits, or masses Cardiac: RRR; no murmurs, rubs, or gallops,no edema  Respiratory:  clear to auscultation bilaterally, normal work of breathing GI: soft, nontender, nondistended, + BS MS: no deformity or atrophy Skin: warm and dry, no rash Neuro:  Strength and sensation are intact Psych: euthymic mood, full affect  Recent Labs: 01/12/2024: ALT 17 07/15/2024: BUN 14; Creatinine, Ser 0.63; Hemoglobin 11.8; Magnesium 1.9; Platelets 191; Potassium 3.7; Sodium 139; TSH 1.175    Lipid Panel No results found for: CHOL, HDL, LDLCALC, TRIG    Wt Readings from Last 3 Encounters:  08/10/24 198 lb 6 oz (90 kg)  10/20/23 202 lb (91.6 kg)  06/08/21 (!) 327 lb (148.3 kg)     ASSESSMENT AND PLAN:  Problem List Items Addressed This Visit     Morbid obesity with BMI of 50.0-59.9, adult  (HCC)   PCOS (polycystic ovarian syndrome)   Other Visit Diagnoses       PVC's (premature ventricular contractions)    -  Primary   Relevant Orders   EKG 12-Lead (Completed)     Anxiety       Relevant Medications   buPROPion (WELLBUTRIN XL) 300 MG 24 hr tablet     OSA (obstructive sleep apnea)          Frequent PVCs Noted in the ER in the setting of anxiety Numerous stressors discussed with her including 7 children, 1 very difficult 30-year-old child with ADHD, sleep deprivation, history of gastric sleeve - Symptoms mildly improved on metoprolol  tartrate 12.5 twice daily Zio monitor ordered to estimate PVC burden  Anxiety She reports sleeping better with Ativan available Sleep deprivation/sleep hygiene a major issue - 7 children exacerbating her stress especially 31-year-old with ADHD Feels her stress level run much higher 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 [facility] [place], [place] [phone]

## 2024-08-10 ENCOUNTER — Encounter: Payer: Self-pay | Admitting: Cardiovascular Disease

## 2024-08-10 ENCOUNTER — Ambulatory Visit

## 2024-08-10 ENCOUNTER — Ambulatory Visit: Attending: Cardiovascular Disease | Admitting: Cardiovascular Disease

## 2024-08-10 VITALS — BP 130/80 | HR 58 | Ht 61.0 in | Wt 198.4 lb

## 2024-08-10 DIAGNOSIS — F419 Anxiety disorder, unspecified: Secondary | ICD-10-CM

## 2024-08-10 DIAGNOSIS — E282 Polycystic ovarian syndrome: Secondary | ICD-10-CM | POA: Diagnosis not present

## 2024-08-10 DIAGNOSIS — Z6841 Body Mass Index (BMI) 40.0 and over, adult: Secondary | ICD-10-CM

## 2024-08-10 DIAGNOSIS — I493 Ventricular premature depolarization: Secondary | ICD-10-CM

## 2024-08-10 DIAGNOSIS — G4733 Obstructive sleep apnea (adult) (pediatric): Secondary | ICD-10-CM

## 2024-08-10 MED ORDER — METOPROLOL TARTRATE 25 MG PO TABS
12.5000 mg | ORAL_TABLET | Freq: Two times a day (BID) | ORAL | 1 refills | Status: AC
Start: 2024-08-10 — End: ?

## 2024-08-10 NOTE — Patient Instructions (Addendum)
 Medication Instructions:   No changes  If you need a refill on your cardiac medications before your next appointment, please call your pharmacy.   Lab work: No new labs needed  Testing/Procedures:  Your physician has recommended that you wear a Zio monitor.   This monitor is a medical device that records the heart's electrical activity. Doctors most often use these monitors to diagnose arrhythmias. Arrhythmias are problems with the speed or rhythm of the heartbeat. The monitor is a small device applied to your chest. You can wear one while you do your normal daily activities. While wearing this monitor if you have any symptoms to push the button and record what you felt. Once you have worn this monitor for the period of time provider prescribed (Usually 14 days), you will return the monitor device in the postage paid box. Once it is returned they will download the data collected and provide us  with a report which the provider will then review and we will call you with those results. Important tips:  Avoid showering during the first 24 hours of wearing the monitor. Avoid excessive sweating to help maximize wear time. Do not submerge the device, no hot tubs, and no swimming pools. Keep any lotions or oils away from the patch. After 24 hours you may shower with the patch on. Take brief showers with your back facing the shower head.  Do not remove patch once it has been placed because that will interrupt data and decrease adhesive wear time. Push the button when you have any symptoms and write down what you were feeling. Once you have completed wearing your monitor, remove and place into box which has postage paid and place in your outgoing mailbox.  If for some reason you have misplaced your box then call our office and we can provide another box and/or mail it off for you.   Follow-Up: At [facility], you and your health needs are our priority.  As part of our continuing mission to provide  you with exceptional heart care, we have created designated Provider Care Teams.  These Care Teams include your primary Cardiologist (physician) and Advanced Practice Providers (APPs -  Physician Assistants and Nurse Practitioners) who all work together to provide you with the care you need, when you need it.  You will need a follow up appointment as needed  Providers on your designated Care Team:   Lonni Meager, NP Bernardino Bring, PA-C [place], [place]  COVID-19 Vaccine Information can be found at: [email or web address] For questions related to vaccine distribution or [email or web address] please email [email or web address] or call [phone].

## 2024-09-19 ENCOUNTER — Ambulatory Visit: Payer: Self-pay | Admitting: Cardiovascular Disease

## 2024-09-19 DIAGNOSIS — I493 Ventricular premature depolarization: Secondary | ICD-10-CM

## 2024-09-27 ENCOUNTER — Encounter: Payer: Self-pay | Admitting: Emergency Medicine
